# Patient Record
Sex: Female | Born: 1950
Health system: Southern US, Community
[De-identification: ages and names within clinical notes are randomized; demographics above are authoritative.]

## PROBLEM LIST (undated history)

## (undated) DIAGNOSIS — G808 Other cerebral palsy: Secondary | ICD-10-CM

## (undated) DIAGNOSIS — R0602 Shortness of breath: Secondary | ICD-10-CM

## (undated) DIAGNOSIS — M1990 Unspecified osteoarthritis, unspecified site: Secondary | ICD-10-CM

## (undated) DIAGNOSIS — J302 Other seasonal allergic rhinitis: Secondary | ICD-10-CM

## (undated) DIAGNOSIS — F988 Other specified behavioral and emotional disorders with onset usually occurring in childhood and adolescence: Secondary | ICD-10-CM

## (undated) DIAGNOSIS — F419 Anxiety disorder, unspecified: Secondary | ICD-10-CM

## (undated) DIAGNOSIS — C801 Malignant (primary) neoplasm, unspecified: Secondary | ICD-10-CM

## (undated) DIAGNOSIS — E785 Hyperlipidemia, unspecified: Secondary | ICD-10-CM

## (undated) DIAGNOSIS — C4491 Basal cell carcinoma of skin, unspecified: Secondary | ICD-10-CM

## (undated) DIAGNOSIS — C50511 Malignant neoplasm of lower-outer quadrant of right female breast: Secondary | ICD-10-CM

## (undated) DIAGNOSIS — G3184 Mild cognitive impairment, so stated: Secondary | ICD-10-CM

## (undated) HISTORY — PX: COLONOSCOPY: SHX174

## (undated) HISTORY — PX: GREAT TOE ARTHRODESIS, INTERPHALANGEAL JOINT: SUR55

## (undated) HISTORY — DX: Other specified behavioral and emotional disorders with onset usually occurring in childhood and adolescence: F98.8

## (undated) HISTORY — DX: Mild cognitive impairment, so stated: G31.84

## (undated) HISTORY — DX: Basal cell carcinoma of skin, unspecified: C44.91

## (undated) HISTORY — DX: Malignant neoplasm of lower-outer quadrant of right female breast: C50.511

## (undated) HISTORY — DX: Other cerebral palsy: G80.8

---

## 1997-04-11 ENCOUNTER — Other Ambulatory Visit: Admission: RE | Admit: 1997-04-11 | Discharge: 1997-04-11 | Payer: Self-pay | Admitting: Obstetrics and Gynecology

## 1997-09-19 ENCOUNTER — Ambulatory Visit (HOSPITAL_COMMUNITY): Admission: RE | Admit: 1997-09-19 | Discharge: 1997-09-19 | Payer: Self-pay | Admitting: General Surgery

## 1997-10-04 ENCOUNTER — Encounter: Payer: Self-pay | Admitting: General Surgery

## 1997-10-04 ENCOUNTER — Ambulatory Visit (HOSPITAL_COMMUNITY): Admission: RE | Admit: 1997-10-04 | Discharge: 1997-10-04 | Payer: Self-pay | Admitting: General Surgery

## 1997-10-07 ENCOUNTER — Other Ambulatory Visit: Admission: RE | Admit: 1997-10-07 | Discharge: 1997-10-07 | Payer: Self-pay | Admitting: Obstetrics and Gynecology

## 1997-11-01 ENCOUNTER — Ambulatory Visit: Admission: RE | Admit: 1997-11-01 | Discharge: 1997-11-01 | Payer: Self-pay | Admitting: Gynecologic Oncology

## 1997-11-02 ENCOUNTER — Ambulatory Visit (HOSPITAL_COMMUNITY): Admission: RE | Admit: 1997-11-02 | Discharge: 1997-11-02 | Payer: Self-pay | Admitting: Gastroenterology

## 1998-01-21 HISTORY — PX: ABDOMINAL HYSTERECTOMY: SHX81

## 1998-01-24 ENCOUNTER — Ambulatory Visit: Admission: RE | Admit: 1998-01-24 | Discharge: 1998-01-24 | Payer: Self-pay | Admitting: Gynecology

## 1998-02-14 ENCOUNTER — Ambulatory Visit: Admission: RE | Admit: 1998-02-14 | Discharge: 1998-02-14 | Payer: Self-pay | Admitting: Gynecology

## 1998-11-30 ENCOUNTER — Other Ambulatory Visit: Admission: RE | Admit: 1998-11-30 | Discharge: 1998-11-30 | Payer: Self-pay | Admitting: Obstetrics and Gynecology

## 2001-12-14 ENCOUNTER — Encounter: Payer: Self-pay | Admitting: General Surgery

## 2001-12-14 ENCOUNTER — Ambulatory Visit (HOSPITAL_COMMUNITY): Admission: RE | Admit: 2001-12-14 | Discharge: 2001-12-14 | Payer: Self-pay | Admitting: General Surgery

## 2001-12-16 ENCOUNTER — Ambulatory Visit (HOSPITAL_COMMUNITY): Admission: RE | Admit: 2001-12-16 | Discharge: 2001-12-16 | Payer: Self-pay | Admitting: General Surgery

## 2001-12-16 ENCOUNTER — Encounter: Payer: Self-pay | Admitting: General Surgery

## 2001-12-31 ENCOUNTER — Encounter: Admission: RE | Admit: 2001-12-31 | Discharge: 2001-12-31 | Payer: Self-pay | Admitting: General Surgery

## 2001-12-31 ENCOUNTER — Encounter: Payer: Self-pay | Admitting: General Surgery

## 2002-06-15 ENCOUNTER — Encounter: Admission: RE | Admit: 2002-06-15 | Discharge: 2002-06-15 | Payer: Self-pay | Admitting: General Surgery

## 2002-06-15 ENCOUNTER — Encounter: Payer: Self-pay | Admitting: General Surgery

## 2003-06-14 DIAGNOSIS — C4491 Basal cell carcinoma of skin, unspecified: Secondary | ICD-10-CM

## 2003-06-14 HISTORY — DX: Basal cell carcinoma of skin, unspecified: C44.91

## 2003-07-06 ENCOUNTER — Ambulatory Visit (HOSPITAL_COMMUNITY): Admission: RE | Admit: 2003-07-06 | Discharge: 2003-07-06 | Payer: Self-pay | Admitting: Gastroenterology

## 2003-07-06 ENCOUNTER — Encounter (INDEPENDENT_AMBULATORY_CARE_PROVIDER_SITE_OTHER): Payer: Self-pay | Admitting: Specialist

## 2003-07-28 ENCOUNTER — Encounter: Admission: RE | Admit: 2003-07-28 | Discharge: 2003-07-28 | Payer: Self-pay | Admitting: General Surgery

## 2003-08-09 ENCOUNTER — Ambulatory Visit (HOSPITAL_COMMUNITY): Admission: RE | Admit: 2003-08-09 | Discharge: 2003-08-09 | Payer: Self-pay | Admitting: Ophthalmology

## 2003-09-22 ENCOUNTER — Ambulatory Visit (HOSPITAL_COMMUNITY): Admission: RE | Admit: 2003-09-22 | Discharge: 2003-09-22 | Payer: Self-pay | Admitting: Plastic Surgery

## 2003-09-22 ENCOUNTER — Encounter (INDEPENDENT_AMBULATORY_CARE_PROVIDER_SITE_OTHER): Payer: Self-pay | Admitting: Specialist

## 2003-09-22 ENCOUNTER — Ambulatory Visit (HOSPITAL_BASED_OUTPATIENT_CLINIC_OR_DEPARTMENT_OTHER): Admission: RE | Admit: 2003-09-22 | Discharge: 2003-09-22 | Payer: Self-pay | Admitting: Plastic Surgery

## 2005-05-22 ENCOUNTER — Encounter: Payer: Self-pay | Admitting: General Surgery

## 2005-09-02 ENCOUNTER — Encounter: Admission: RE | Admit: 2005-09-02 | Discharge: 2005-09-02 | Payer: Self-pay | Admitting: General Surgery

## 2005-09-02 ENCOUNTER — Ambulatory Visit (HOSPITAL_BASED_OUTPATIENT_CLINIC_OR_DEPARTMENT_OTHER): Admission: RE | Admit: 2005-09-02 | Discharge: 2005-09-02 | Payer: Self-pay | Admitting: General Surgery

## 2005-09-02 ENCOUNTER — Encounter (INDEPENDENT_AMBULATORY_CARE_PROVIDER_SITE_OTHER): Payer: Self-pay | Admitting: *Deleted

## 2009-10-21 HISTORY — PX: BREAST SURGERY: SHX581

## 2009-11-06 ENCOUNTER — Ambulatory Visit (HOSPITAL_COMMUNITY): Admission: RE | Admit: 2009-11-06 | Discharge: 2009-11-06 | Payer: Self-pay | Admitting: Surgery

## 2009-11-06 ENCOUNTER — Encounter: Admission: RE | Admit: 2009-11-06 | Discharge: 2009-11-06 | Payer: Self-pay | Admitting: Surgery

## 2009-11-07 ENCOUNTER — Ambulatory Visit: Payer: Self-pay | Admitting: Oncology

## 2009-12-21 HISTORY — PX: BREAST SURGERY: SHX581

## 2010-01-03 ENCOUNTER — Ambulatory Visit: Payer: Self-pay | Admitting: Psychiatry

## 2010-01-03 ENCOUNTER — Ambulatory Visit: Payer: Self-pay | Admitting: Oncology

## 2010-01-08 ENCOUNTER — Ambulatory Visit
Admission: RE | Admit: 2010-01-08 | Discharge: 2010-01-18 | Payer: Self-pay | Source: Home / Self Care | Attending: Radiation Oncology | Admitting: Radiation Oncology

## 2010-01-26 ENCOUNTER — Ambulatory Visit
Admission: RE | Admit: 2010-01-26 | Discharge: 2010-02-20 | Payer: Self-pay | Source: Home / Self Care | Attending: Radiation Oncology | Admitting: Radiation Oncology

## 2010-02-02 ENCOUNTER — Encounter
Admission: RE | Admit: 2010-02-02 | Discharge: 2010-02-02 | Payer: Self-pay | Source: Home / Self Care | Attending: Radiation Oncology | Admitting: Radiation Oncology

## 2010-02-11 ENCOUNTER — Encounter: Payer: Self-pay | Admitting: General Surgery

## 2010-02-19 ENCOUNTER — Ambulatory Visit: Payer: Self-pay | Admitting: Oncology

## 2010-02-21 ENCOUNTER — Ambulatory Visit: Payer: BC Managed Care – PPO | Attending: Radiation Oncology | Admitting: Radiation Oncology

## 2010-02-21 DIAGNOSIS — F411 Generalized anxiety disorder: Secondary | ICD-10-CM | POA: Insufficient documentation

## 2010-02-21 DIAGNOSIS — R5381 Other malaise: Secondary | ICD-10-CM | POA: Insufficient documentation

## 2010-02-21 DIAGNOSIS — R21 Rash and other nonspecific skin eruption: Secondary | ICD-10-CM | POA: Insufficient documentation

## 2010-02-21 DIAGNOSIS — J069 Acute upper respiratory infection, unspecified: Secondary | ICD-10-CM | POA: Insufficient documentation

## 2010-02-21 DIAGNOSIS — C50919 Malignant neoplasm of unspecified site of unspecified female breast: Secondary | ICD-10-CM | POA: Insufficient documentation

## 2010-02-21 DIAGNOSIS — R5383 Other fatigue: Secondary | ICD-10-CM | POA: Insufficient documentation

## 2010-02-21 DIAGNOSIS — Z510 Encounter for antineoplastic radiation therapy: Secondary | ICD-10-CM | POA: Insufficient documentation

## 2010-02-21 DIAGNOSIS — N644 Mastodynia: Secondary | ICD-10-CM | POA: Insufficient documentation

## 2010-02-27 ENCOUNTER — Ambulatory Visit: Payer: BC Managed Care – PPO | Admitting: Radiation Oncology

## 2010-04-05 LAB — CANCER ANTIGEN 27.29: CA 27.29: 21 U/mL (ref 0–39)

## 2010-04-05 LAB — COMPREHENSIVE METABOLIC PANEL
ALT: 21 U/L (ref 0–35)
AST: 20 U/L (ref 0–37)
Albumin: 4.3 g/dL (ref 3.5–5.2)
Alkaline Phosphatase: 109 U/L (ref 39–117)
BUN: 16 mg/dL (ref 6–23)
CO2: 28 mEq/L (ref 19–32)
Calcium: 9.6 mg/dL (ref 8.4–10.5)
Chloride: 106 mEq/L (ref 96–112)
Creatinine, Ser: 0.75 mg/dL (ref 0.4–1.2)
GFR calc Af Amer: >60 mL/min (ref >60)
GFR calc non Af Amer: >60 mL/min (ref >60)
Glucose, Bld: 94 mg/dL (ref 70–99)
Potassium: 3.4 mEq/L — ABNORMAL LOW (ref 3.5–5.1)
Sodium: 141 mEq/L (ref 135–145)
Total Bilirubin: 0.5 mg/dL (ref 0.3–1.2)
Total Protein: 7.1 g/dL (ref 6.0–8.3)

## 2010-04-05 LAB — DIFFERENTIAL
Eosinophils Absolute: 0.3 10*3/uL (ref 0.0–0.7)
Lymphocytes Relative: 23 % (ref 12–46)
Lymphs Abs: 2 10*3/uL (ref 0.7–4.0)
Monocytes Relative: 6 % (ref 3–12)
Neutro Abs: 5.9 10*3/uL (ref 1.7–7.7)
Neutrophils Relative %: 68 % (ref 43–77)

## 2010-04-05 LAB — CBC
HCT: 40.9 % (ref 36.0–46.0)
MCH: 30.6 pg (ref 26.0–34.0)
MCHC: 33.5 g/dL (ref 30.0–36.0)
RDW: 12.5 % (ref 11.5–15.5)

## 2010-04-05 LAB — SURGICAL PCR SCREEN
MRSA, PCR: NEGATIVE
Staphylococcus aureus: NEGATIVE

## 2010-04-05 LAB — URINALYSIS, ROUTINE W REFLEX MICROSCOPIC
Bilirubin Urine: NEGATIVE
Glucose, UA: NEGATIVE mg/dL
Hgb urine dipstick: NEGATIVE
Ketones, ur: NEGATIVE mg/dL
Nitrite: NEGATIVE
Protein, ur: NEGATIVE mg/dL
Specific Gravity, Urine: 1.023 (ref 1.005–1.030)
Urobilinogen, UA: 1 mg/dL (ref 0.0–1.0)
pH: 6.5 (ref 5.0–8.0)

## 2010-04-23 ENCOUNTER — Encounter (HOSPITAL_BASED_OUTPATIENT_CLINIC_OR_DEPARTMENT_OTHER): Payer: BC Managed Care – PPO | Admitting: Oncology

## 2010-04-23 DIAGNOSIS — C50919 Malignant neoplasm of unspecified site of unspecified female breast: Secondary | ICD-10-CM

## 2010-04-23 DIAGNOSIS — Z8522 Personal history of malignant neoplasm of nasal cavities, middle ear, and accessory sinuses: Secondary | ICD-10-CM

## 2010-04-23 DIAGNOSIS — M1990 Unspecified osteoarthritis, unspecified site: Secondary | ICD-10-CM

## 2010-05-15 ENCOUNTER — Ambulatory Visit: Payer: BC Managed Care – PPO | Attending: Radiation Oncology | Admitting: Radiation Oncology

## 2010-06-08 NOTE — Op Note (Signed)
NAME:  Jennifer Eaton, Jennifer Eaton                      ACCOUNT NO.:  [id number]   MEDICAL RECORD NO.:  [id number]                   PATIENT TYPE:  AMB   LOCATION:  ENDO                                 FACILITY:  [facility]   PHYSICIAN:  John C. Madilyn Fireman, M.D.                 DATE OF BIRTH:  08-02-1950   DATE OF PROCEDURE:  07/06/2003  DATE OF DISCHARGE:                                 OPERATIVE REPORT   PROCEDURE:  Colonoscopy with polypectomy.   INDICATIONS FOR PROCEDURE:  Personal history of breast cancer with last  colon screening in 1992.   ENDOSCOPIST:  Everardo All. Madilyn Fireman, M.D.   PROCEDURE:  The patient was placed in the left lateral decubitus position  and placed on the pulse monitor with continuous low-flow oxygen delivered by  nasal cannula.  She was sedated with 62.5 mcg of IV Fentanyl and 6 mg of IV  Versed.  The Olympus video colonoscope was inserted into the rectum and  advanced through the cecum, confirmed by transillumination of McBurney's  point and visualization of the ileocecal valve and appendiceal orifice.  The  prep was good.  The cecum appeared normal. At the junction between the cecum  and ascending colon across from the ileocecal valve there was a soft,  multilobulated, sessile polyp approximately 1.5 to 1.75 cm in diameter.  This was removed by snare.  The remainder of the ascending, transverse,  descending, sigmoid and rectum appeared normal with no further polyps,  masses, diverticula, or other mucosal abnormalities.   The scope was then withdrawn and the patient returned to the recovery room  in stable condition.  She tolerated the procedure well; and there were no  immediate complications.   IMPRESSION:  Ascending colon polyp, otherwise normal study.   PLAN:  Await histology to determine interval for next colonoscopy.                                               John C. Madilyn Fireman, M.D.    JCH/MEDQ  D:  07/06/2003  T:  07/06/2003  Job:  [id number]   cc:   Dellis Anes. Idell Pickles,  M.D.  [address]  [place]  [place] [address]  Fax: [phone]

## 2010-06-08 NOTE — Op Note (Signed)
NAME:  Jennifer Eaton, Jennifer Eaton                      ACCOUNT NO.:  [id number]   MEDICAL RECORD NO.:  [id number]                   PATIENT TYPE:  OIB   LOCATION:  2859                                 FACILITY:  [facility]   PHYSICIAN:  Robert L. Dione Booze, M.D.               DATE OF BIRTH:  January 15, 1951   DATE OF PROCEDURE:  08/09/2003  DATE OF DISCHARGE:  08/09/2003                                 OPERATIVE REPORT   PREOPERATIVE DIAGNOSIS:  Moderately severe dermatochalasis of the skin of  each upper eyelid causing visual impairment and visual symptoms.   POSTOPERATIVE DIAGNOSIS:  Moderately severe dermatochalasis of the skin of  each upper eyelid causing visual impairment and visual symptoms.   OPERATION PERFORMED:  Upper eyelid blepharoplasty.   SURGEON:  Robert L. Dione Booze, M.D.   ANESTHESIA:  1% Xylocaine with epinephrine.   INDICATIONS AND JUSTIFICATIONS FOR THE PROCEDURE:  Jennifer Eaton is a 60-  year-old lady seen in my office through the referral of friends of hers.  She basically was seen regarding droopy eyelids and has noticed that this  bothers her enormously and has gotten worse the last year.  She feels this  causes irritation, and she can feel the weight of the lids and it makes her  tired.  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  The photograph  shows the margin reflex distance on the right is basically 0 mm since the  skin comes to the pupillary reflex and on the left is probably about 3 mm.  Visual field testing, however, shows a very significant loss of  approximately 75% of the visual field in the left eye when the skin is taped  compared to when it is not tapped and approximately 80% or even 30 degrees  of the field in the right eye when the skin is taped compared to when it is  not taped.  Permission has been  obtained to perform blepharoplasties for  medical reasons to help her symptoms.   Otherwise the exam shows the pressure is 12 in each eye and her vision  corrected to 20/25 in each eye.  The pupils, motility, conjunctiva, anterior  chamber, and dilated fundus exam are unremarkable except she does have  slightly shallow anterior chambers, possibly making her a mild candidate  eventually for narrow-angle glaucoma, although this is not an immediate  concern.  She also reports that she even has to pull the skin up to see  better.   JUSTIFICATION FOR PERFORMING PROCEDURE IN OUTPATIENT SETTING:  Routine.   JUSTIFICATION FOR OVERNIGHT STAY:  None.   PROCEDURE:  The patient arrived in the minor surgery room and was prepped  and draped in the routine fashion and had the administration of 1% Xylocaine  with epinephrine  to the skin of each upper eyelid.  The skin to be removed  was carefully demarcated and then excised.  There was a significant amount  of bleeding that was controlled with pressure and cautery, and underlying  fatty tissue was then excised.  Each wound was closed with a running 6-0  nylon suture and the skin was then cleaned carefully and Polysporin ointment  was put inside each lower lid and on the skin.  Cold compresses were then  applied.  The patient went home, having done nicely.   FOLLOW-UP CARE:  The patient is to be seen in my office in six days to have  the sutures removed.  She is to use warm compresses starting one day after  the surgery.                                               Robert L. Dione Booze, M.D.    RLG/MEDQ  D:  08/09/2003  T:  08/10/2003  Job:  [id number]   cc:   Dellis Anes. Idell Pickles, M.D.  [address]  [place]  [place] [address]  Fax: [phone]   Dr. Ether Griffins

## 2010-06-08 NOTE — Op Note (Addendum)
NAMESHILOH, Jennifer Eaton            ACCOUNT NO.:  [id number]   MEDICAL RECORD NO.:  [id number]          PATIENT TYPE:  AMB   LOCATION:  DSC                          FACILITY:  [facility]   PHYSICIAN:  Rose Phi. Maple Hudson, M.D.   DATE OF BIRTH:  Jun 07, 1950   DATE OF PROCEDURE:  DATE OF DISCHARGE:                                 OPERATIVE REPORT   PREOPERATIVE DIAGNOSIS:  Right breast nodule.   POSTOPERATIVE DIAGNOSIS:  Right breast nodule.   PROCEDURE:  Excision of right breast nodule with needle localization ____                                                           QA MARKER: 19____ mammogram.                                                SURGEON:  Rose Phi. Maple Hudson, M.D.   ANESTHESIA:  General.   OPERATIVE PROCEDURE:  Prior to coming to the operating room a localizing  wire was placed in the nodule at the 9:00 position of the right breast.   After suitable general anesthesia was induced, the patient was placed in the  supine position with arms extended on the arm board.  Right breast was  prepped and draped in the usual fashion.   A curved incision using the wire as a guide was then outlined with a marking  pencil and area infiltrated with a local anesthetic.  Incision was made and  the wire exposed, and the wire and surrounding tissue were excised.  Specimen mammogram confirmed the removal of the lesion.  Hemostasis obtained  with a cautery.  The deeper layers were closed with 3-0 Vicryl and the skin  with subcuticular 4-0 Monocryl and Steri-Strips.  Dressing applied.  The  patient transferred to the recovery room in satisfactory condition having  tolerated the procedure well.      Rose Phi. Maple Hudson, M.D.  Electronically Signed     PRY/MEDQ  D:  09/02/2005  T:  09/03/2005  Job:  [id number]

## 2010-07-11 ENCOUNTER — Other Ambulatory Visit: Payer: Self-pay | Admitting: Oncology

## 2010-07-11 ENCOUNTER — Encounter (HOSPITAL_BASED_OUTPATIENT_CLINIC_OR_DEPARTMENT_OTHER): Payer: BC Managed Care – PPO | Admitting: Oncology

## 2010-07-11 DIAGNOSIS — Z8522 Personal history of malignant neoplasm of nasal cavities, middle ear, and accessory sinuses: Secondary | ICD-10-CM

## 2010-07-11 DIAGNOSIS — M1990 Unspecified osteoarthritis, unspecified site: Secondary | ICD-10-CM

## 2010-07-11 DIAGNOSIS — C50919 Malignant neoplasm of unspecified site of unspecified female breast: Secondary | ICD-10-CM

## 2010-07-11 LAB — CBC WITH DIFFERENTIAL/PLATELET
BASO%: 0.4 % (ref 0.0–2.0)
Basophils Absolute: 0 10*3/uL (ref 0.0–0.1)
Eosinophils Absolute: 0.2 10*3/uL (ref 0.0–0.5)
HCT: 38.8 % (ref 34.8–46.6)
HGB: 13.4 g/dL (ref 11.6–15.9)
MCH: 31.1 pg (ref 25.1–34.0)
MCHC: 34.6 g/dL (ref 31.5–36.0)
MCV: 89.9 fL (ref 79.5–101.0)
MONO%: 7.4 % (ref 0.0–14.0)
NEUT#: 4 10*3/uL (ref 1.5–6.5)
NEUT%: 72.4 % (ref 38.4–76.8)
Platelets: 240 10*3/uL (ref 145–400)
RBC: 4.31 10*6/uL (ref 3.70–5.45)
RDW: 12.3 % (ref 11.2–14.5)
WBC: 5.5 10*3/uL (ref 3.9–10.3)
lymph#: 0.9 10*3/uL (ref 0.9–3.3)

## 2010-07-11 LAB — COMPREHENSIVE METABOLIC PANEL
AST: 31 U/L (ref 0–37)
Albumin: 4.7 g/dL (ref 3.5–5.2)
Alkaline Phosphatase: 89 U/L (ref 39–117)
BUN: 13 mg/dL (ref 6–23)
CO2: 24 mEq/L (ref 19–32)
Calcium: 9.8 mg/dL (ref 8.4–10.5)
Chloride: 105 mEq/L (ref 96–112)
Creatinine, Ser: 0.88 mg/dL (ref 0.50–1.10)
Glucose, Bld: 89 mg/dL (ref 70–99)
Potassium: 3.7 mEq/L (ref 3.5–5.3)
Sodium: 139 mEq/L (ref 135–145)
Total Protein: 6.8 g/dL (ref 6.0–8.3)

## 2010-07-18 ENCOUNTER — Telehealth (INDEPENDENT_AMBULATORY_CARE_PROVIDER_SITE_OTHER): Payer: Self-pay | Admitting: Surgery

## 2010-10-18 ENCOUNTER — Encounter (HOSPITAL_BASED_OUTPATIENT_CLINIC_OR_DEPARTMENT_OTHER): Payer: BC Managed Care – PPO | Admitting: Oncology

## 2010-10-18 ENCOUNTER — Other Ambulatory Visit: Payer: Self-pay | Admitting: Oncology

## 2010-10-18 DIAGNOSIS — Z8522 Personal history of malignant neoplasm of nasal cavities, middle ear, and accessory sinuses: Secondary | ICD-10-CM

## 2010-10-18 DIAGNOSIS — M1990 Unspecified osteoarthritis, unspecified site: Secondary | ICD-10-CM

## 2010-10-18 DIAGNOSIS — C50919 Malignant neoplasm of unspecified site of unspecified female breast: Secondary | ICD-10-CM

## 2010-10-18 LAB — CBC WITH DIFFERENTIAL/PLATELET
Basophils Absolute: 0 10*3/uL (ref 0.0–0.1)
EOS%: 4.4 % (ref 0.0–7.0)
HCT: 38.1 % (ref 34.8–46.6)
LYMPH%: 16.8 % (ref 14.0–49.7)
MCH: 31.3 pg (ref 25.1–34.0)
MCHC: 34.7 g/dL (ref 31.5–36.0)
MCV: 90.1 fL (ref 79.5–101.0)
MONO#: 0.3 10*3/uL (ref 0.1–0.9)
MONO%: 5.5 % (ref 0.0–14.0)
NEUT#: 4.3 10*3/uL (ref 1.5–6.5)
NEUT%: 73 % (ref 38.4–76.8)
Platelets: 230 10*3/uL (ref 145–400)
RBC: 4.22 10*6/uL (ref 3.70–5.45)
RDW: 12.4 % (ref 11.2–14.5)
lymph#: 1 10*3/uL (ref 0.9–3.3)

## 2010-10-18 LAB — COMPREHENSIVE METABOLIC PANEL
ALT: 28 U/L (ref 0–35)
AST: 22 U/L (ref 0–37)
Albumin: 3.9 g/dL (ref 3.5–5.2)
Alkaline Phosphatase: 103 U/L (ref 39–117)
BUN: 15 mg/dL (ref 6–23)
CO2: 25 mEq/L (ref 19–32)
Calcium: 9.9 mg/dL (ref 8.4–10.5)
Chloride: 101 mEq/L (ref 96–112)
Potassium: 3.5 mEq/L (ref 3.5–5.3)
Sodium: 138 mEq/L (ref 135–145)
Total Bilirubin: 0.2 mg/dL — ABNORMAL LOW (ref 0.3–1.2)
Total Protein: 7.5 g/dL (ref 6.0–8.3)

## 2010-10-25 ENCOUNTER — Encounter (HOSPITAL_BASED_OUTPATIENT_CLINIC_OR_DEPARTMENT_OTHER): Payer: BC Managed Care – PPO | Admitting: Oncology

## 2010-10-25 DIAGNOSIS — C50919 Malignant neoplasm of unspecified site of unspecified female breast: Secondary | ICD-10-CM

## 2010-12-04 NOTE — Progress Notes (Signed)
This encounter was created in error - please disregard.

## 2010-12-24 ENCOUNTER — Telehealth: Payer: Self-pay | Admitting: *Deleted

## 2010-12-24 NOTE — Telephone Encounter (Signed)
patient confirmed over the phone the new date and time on 02-2011 appointments

## 2011-01-22 HISTORY — PX: MOUTH SURGERY: SHX715

## 2011-01-23 ENCOUNTER — Other Ambulatory Visit: Payer: Self-pay

## 2011-01-23 DIAGNOSIS — C50919 Malignant neoplasm of unspecified site of unspecified female breast: Secondary | ICD-10-CM

## 2011-01-23 MED ORDER — LETROZOLE 2.5 MG PO TABS: 2.5000 mg | ORAL_TABLET | Freq: Every day | ORAL | Status: AC

## 2011-03-01 ENCOUNTER — Other Ambulatory Visit: Payer: BC Managed Care – PPO | Admitting: Lab

## 2011-03-04 ENCOUNTER — Other Ambulatory Visit: Payer: BC Managed Care – PPO | Admitting: Lab

## 2011-03-05 ENCOUNTER — Ambulatory Visit (HOSPITAL_BASED_OUTPATIENT_CLINIC_OR_DEPARTMENT_OTHER): Payer: BC Managed Care – PPO | Admitting: Oncology

## 2011-03-05 ENCOUNTER — Other Ambulatory Visit: Payer: BC Managed Care – PPO

## 2011-03-05 VITALS — BP 125/81 | HR 85 | Temp 98.6°F | Ht 65.0 in | Wt 224.5 lb

## 2011-03-05 DIAGNOSIS — Z8522 Personal history of malignant neoplasm of nasal cavities, middle ear, and accessory sinuses: Secondary | ICD-10-CM

## 2011-03-05 DIAGNOSIS — C50919 Malignant neoplasm of unspecified site of unspecified female breast: Secondary | ICD-10-CM | POA: Insufficient documentation

## 2011-03-05 DIAGNOSIS — M1990 Unspecified osteoarthritis, unspecified site: Secondary | ICD-10-CM

## 2011-03-05 LAB — CBC WITH DIFFERENTIAL/PLATELET
BASO%: 0.2 % (ref 0.0–2.0)
Basophils Absolute: 0 10*3/uL (ref 0.0–0.1)
EOS%: 1.8 % (ref 0.0–7.0)
HCT: 40.4 % (ref 34.8–46.6)
HGB: 14.1 g/dL (ref 11.6–15.9)
LYMPH%: 16.9 % (ref 14.0–49.7)
MCH: 31.4 pg (ref 25.1–34.0)
MCHC: 35 g/dL (ref 31.5–36.0)
MCV: 89.6 fL (ref 79.5–101.0)
MONO#: 0.5 10*3/uL (ref 0.1–0.9)
MONO%: 6.4 % (ref 0.0–14.0)
NEUT#: 6.1 10*3/uL (ref 1.5–6.5)
NEUT%: 74.7 % (ref 38.4–76.8)
Platelets: 271 10*3/uL (ref 145–400)
RBC: 4.51 10*6/uL (ref 3.70–5.45)
RDW: 12.6 % (ref 11.2–14.5)
WBC: 8.1 10*3/uL (ref 3.9–10.3)
lymph#: 1.4 10*3/uL (ref 0.9–3.3)

## 2011-03-05 LAB — COMPREHENSIVE METABOLIC PANEL
ALT: 31 U/L (ref 0–35)
AST: 25 U/L (ref 0–37)
Albumin: 4.2 g/dL (ref 3.5–5.2)
Alkaline Phosphatase: 97 U/L (ref 39–117)
BUN: 13 mg/dL (ref 6–23)
CO2: 25 mEq/L (ref 19–32)
Calcium: 10 mg/dL (ref 8.4–10.5)
Chloride: 104 mEq/L (ref 96–112)
Creatinine, Ser: 0.88 mg/dL (ref 0.50–1.10)
Glucose, Bld: 90 mg/dL (ref 70–99)
Potassium: 3.7 mEq/L (ref 3.5–5.3)
Sodium: 140 mEq/L (ref 135–145)
Total Bilirubin: 0.3 mg/dL (ref 0.3–1.2)
Total Protein: 7.9 g/dL (ref 6.0–8.3)

## 2011-03-05 NOTE — Progress Notes (Signed)
ID: DHWANI VENKATESH  DOB: 12-18-1950  MR#: [id number]  CSN#: [id number]   Interval History:   Jennifer Eaton returns today for followup of her breast cancer. The interval history is unremarkable. They're expecting their first grandchild in September and she is excited about that. She's also worried because it's been over a year and she hasn't had a breast MRI. She is getting that done through [facility] under Carylon Perches, she tells me. She just had a a tooth removed of with plans for an implant. She is still on antibiotics for that.  ROS:  Otherwise a detailed review of systems was noncontributory. She has very minimal hot flashes, and no other significant side effects from her letrozole.  Allergies not on file  Current Outpatient Prescriptions  Medication Sig Dispense Refill  . aspirin 81 MG tablet Take 81 mg by mouth daily.      . Calcium-Magnesium 500-250 MG TABS Take 2 tablets by mouth 2 (two) times daily.      . Cholecalciferol (VITAMIN D3) 1000 UNITS CAPS Take 2 capsules by mouth.      . diazepam (VALIUM) 5 MG tablet Take 5 mg by mouth at bedtime as needed.      . fish oil-omega-3 fatty acids 1000 MG capsule Take 1 g by mouth daily.      . fluticasone (FLONASE) 50 MCG/ACT nasal spray Place 2 sprays into the nose daily as needed.      Marland Kitchen letrozole (FEMARA) 2.5 MG tablet Take 2.5 mg by mouth daily.      . Multiple Vitamin CAPS Take by mouth.      . rosuvastatin (CRESTOR) 10 MG tablet Take 10 mg by mouth daily.      Marland Kitchen zolpidem (AMBIEN) 5 MG tablet Take 5 mg by mouth at bedtime as needed.       PAST MEDICAL HISTORY:  Otherwise significant for the patient being status post hysterectomy and bilateral salpingo-oophorectomy in the year 2000. She had some left foot surgery with a plate in her big toe.  She had a basal cell carcinoma with Mohs surgery removed from the nose, and a squamous cell skin cancer removed from the left lateral arm.  She has moderate osteoarthritis problems especially involving the  knees, mild GERD symptoms but no other surgeries or illnesses of concern.  FAMILY HISTORY: The patient's father died at the age of 77 after myocardial infarction.  The patient's mother died from multiple myeloma which was diagnosed at age 65 and took her life 4 years later when she was 62.  The patient has one brother and one sister.  There is no breast cancer in the immediate family but 2 of the patient's maternal aunts had breast cancer one of those two aunts had a daughter, the patient's first cousin, who had ovarian cancer at an early age.  Abena tells me she was tested for the BRCA-1 and 2 genes at [facility] recently and that she was negative.  GYN HISTORY:  She is GX P2, first pregnancy to term at age 51.  She underwent hysterectomy with bilateral salpingo-oophorectomy in the year 2000. She did not take hormone replacement following that.   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  Her husband of 39 years, Onalee Hua, works at [employer] of [place].  Son Nida Boatman lives in [place].  He is 39 and works as a Teacher, English as a foreign language.  Son Casimiro Needle, [place], lives in [place] and works as an Museum/gallery exhibitions officer and  recently returned from 16 months in [place].  The patient has no grandchildren.  She attends a [employer].    Objective: Middle-aged white woman who appears slightly anxious  Filed Vitals:   03/05/11 1607  BP: 125/81  Pulse: 85  Temp: 98.6 F (37 C)    BMI: Body mass index is 37.36 kg/(m^2).   ECOG FS: 0  Physical Exam:   Sclerae unicteric  Oropharynx clear  No peripheral adenopathy  Lungs clear -- no rales or rhonchi  Heart regular rate and rhythm  Abdomen benign  MSK no focal spinal tenderness, no peripheral edema  Neuro nonfocal  Breast exam: The right breast is status post lumpectomy, no evidence of local recurrence, no skin changes, no nipple retraction; the left breast shows no suspicious masses.  Lab Results:      Chemistry      Component Value  Date/Time   NA 138 10/18/2010 1557   NA 138 10/18/2010 1557   K 3.5 10/18/2010 1557   K 3.5 10/18/2010 1557   CL 101 10/18/2010 1557   CL 101 10/18/2010 1557   CO2 25 10/18/2010 1557   CO2 25 10/18/2010 1557   BUN 15 10/18/2010 1557   BUN 15 10/18/2010 1557   CREATININE 0.77 10/18/2010 1557   CREATININE 0.77 10/18/2010 1557      Component Value Date/Time   CALCIUM 9.9 10/18/2010 1557   CALCIUM 9.9 10/18/2010 1557   ALKPHOS 103 10/18/2010 1557   ALKPHOS 103 10/18/2010 1557   AST 22 10/18/2010 1557   AST 22 10/18/2010 1557   ALT 28 10/18/2010 1557   ALT 28 10/18/2010 1557   BILITOT 0.2* 10/18/2010 1557   BILITOT 0.2* 10/18/2010 1557       Lab Results  Component Value Date   WBC 8.1 03/05/2011   HGB 14.1 03/05/2011   HCT 40.4 03/05/2011   MCV 89.6 03/05/2011   PLT 271 03/05/2011   NEUTROABS 6.1 03/05/2011    Studies/Results:  DIGITAL DIAGNOSTIC RIGHT MAMMOGRAM WITH CAD 02/02/2010 Comparison: The multiple prior studies from [facility] dated  12/28/2009 and earlier  Findings: There are scattered fibroglandular densities.  Postoperative changes are seen in the upper-outer quadrant of the  right breast. There are scattered faint calcifications in the  upper-outer quadrant of the right breast. However, no suspicious  microcalcifications are identified.  Mammographic images were processed with CAD.  IMPRESSION:  Expected postoperative changes on the right. No suspicious  microcalcifications identified. Bilateral diagnostic mammogram will  be due in September 2012.   Assessment: A 61 year old [place] woman with a history of intrauterine diethylstilbestrol exposure status post right lumpectomy October 2011 for a either a double T1 (stage IA) or a single T2 (stage IIA) invasive ductal carcinoma, with 0/4 sentinel lymph nodes involved, strongly estrogen and progesterone receptor positive with a low MIB-1 and no HER-2 amplification; Oncotype DX recurrence score was 8 predicting a 6% risk of  recurrence within 10 years with antiestrogen therapy.  She completed radiation in March 2012 and has been on letrozole since with excellent tolerance.     Plan: The plan is to continue letrozole to a total of 5 years. I suggested followup here in 6 months but she is more comfortable with a 4 month followup. She continues to explore the possibility of bilateral mastectomies with reconstruction and she doubtless will be discussing that as well with Dr. Barbaraann Cao at [facility] when she sees him later this month.  Today I wrote her for acyclovir to  take twice a day since she has a history of developing mouth sores whenever she has a dental procedure. Otherwise she knows to call for any problems that may develop before the next visit     Leoncio Hansen C 03/05/2011

## 2011-03-06 ENCOUNTER — Telehealth: Payer: Self-pay | Admitting: *Deleted

## 2011-03-06 NOTE — Telephone Encounter (Signed)
gave patient appointment for 06-2011 

## 2011-06-26 ENCOUNTER — Other Ambulatory Visit: Payer: BC Managed Care – PPO

## 2011-07-03 ENCOUNTER — Ambulatory Visit: Payer: BC Managed Care – PPO | Admitting: Oncology

## 2011-12-31 ENCOUNTER — Other Ambulatory Visit: Payer: Self-pay | Admitting: Orthopedic Surgery

## 2011-12-31 ENCOUNTER — Encounter (HOSPITAL_BASED_OUTPATIENT_CLINIC_OR_DEPARTMENT_OTHER): Payer: Self-pay | Admitting: *Deleted

## 2011-12-31 NOTE — Progress Notes (Signed)
np labs needed-very nervous for a trigger finger hx breast cancer

## 2012-01-02 NOTE — H&P (Signed)
  Jennifer Eaton is an 61 y.o. female.   Chief Complaint: c/o chronic and progressive STS symptoms left thumb HPI: Jennifer Eaton return in December of 2012 with a new predicament affecting her left thumb.  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  She has developed pain at her left thumb MP volar surface and triggering at the IP joint.  On palpation she has a nodule at the site of her A-1 pulley and a small lump in her flexor pollicis longus tendon that moves proximally with thumb IP flexion. She underwent 2 separate injections for the predicament which provided her with only temporary relief. She presented again in early December of 2013 with recurrent symptoms. We instructed her that she would now need to undergo release of the A-1 pulley.   Past Medical History  Diagnosis Date  . Anxiety   . Cancer     hx lt breast cancer  . Shortness of breath   . Arthritis   . Seasonal allergies   . Hyperlipemia     Past Surgical History  Procedure Date  . Abdominal hysterectomy 2000    hyst-BSO  . Breast surgery 10/11    lt lumpectomy-snbx  . Breast surgery 12/11    lt breast re-excision  . Colonoscopy   . Mouth surgery 2013  . Great toe arthrodesis, interphalangeal joint     lt great toe-plate    No family history on file. Social History:  reports that she has never smoked. She does not have any smokeless tobacco history on file. She reports that she does not drink alcohol or use illicit drugs.  Allergies:  Allergies  Allergen Reactions  . Latex Rash    No prescriptions prior to admission    No results found for this or any previous visit (from the past 48 hour(s)).  No results found.   Pertinent items are noted in HPI.  Height 5\' 5"  (1.651 m), weight 101.606 kg (224 lb).  General appearance: alert Head: Normocephalic, without obvious abnormality Neck: supple, symmetrical, trachea midline Resp: clear to auscultation  bilaterally Cardio: regular rate and rhythm GI: normal findings: bowel sounds normal Extremities:active triggering of the left thumb at the A-1 pulley. N/V intact. No other digits with triggering. Pulses: 2+ and symmetric Skin: normal Neurologic: Grossly normal    Assessment/Plan Impression:Chronic STS left thumb  Plan: To the OR for release A-1 pulley left thumb.The procedure, risks,benefits and post-op course were discussed with the patient at length and they were in agreement with the plan.   Jennifer Eaton 01/02/2012, 9:13 PM     H&P documentation: 01/03/2012  -History and Physical Reviewed  -Patient has been re-examined  -No change in the plan of care  Wyn Forster, MD

## 2012-01-03 ENCOUNTER — Ambulatory Visit (HOSPITAL_BASED_OUTPATIENT_CLINIC_OR_DEPARTMENT_OTHER): Payer: BC Managed Care – PPO | Admitting: Certified Registered Nurse Anesthetist

## 2012-01-03 ENCOUNTER — Encounter (HOSPITAL_BASED_OUTPATIENT_CLINIC_OR_DEPARTMENT_OTHER): Payer: Self-pay | Admitting: Certified Registered Nurse Anesthetist

## 2012-01-03 ENCOUNTER — Encounter (HOSPITAL_BASED_OUTPATIENT_CLINIC_OR_DEPARTMENT_OTHER): Payer: Self-pay | Admitting: *Deleted

## 2012-01-03 ENCOUNTER — Ambulatory Visit (HOSPITAL_BASED_OUTPATIENT_CLINIC_OR_DEPARTMENT_OTHER)
Admission: RE | Admit: 2012-01-03 | Discharge: 2012-01-03 | Disposition: A | Discharge status: Home / Self Care | Payer: BC Managed Care – PPO | Source: Ambulatory Visit | Attending: Orthopedic Surgery | Admitting: Orthopedic Surgery

## 2012-01-03 ENCOUNTER — Encounter (HOSPITAL_BASED_OUTPATIENT_CLINIC_OR_DEPARTMENT_OTHER)
Admission: RE | Disposition: A | Discharge status: Home / Self Care | Payer: Self-pay | Source: Ambulatory Visit | Attending: Orthopedic Surgery

## 2012-01-03 DIAGNOSIS — F411 Generalized anxiety disorder: Secondary | ICD-10-CM | POA: Insufficient documentation

## 2012-01-03 DIAGNOSIS — M6530 Trigger finger, unspecified finger: Secondary | ICD-10-CM | POA: Insufficient documentation

## 2012-01-03 DIAGNOSIS — M129 Arthropathy, unspecified: Secondary | ICD-10-CM | POA: Insufficient documentation

## 2012-01-03 DIAGNOSIS — E785 Hyperlipidemia, unspecified: Secondary | ICD-10-CM | POA: Insufficient documentation

## 2012-01-03 DIAGNOSIS — Z853 Personal history of malignant neoplasm of breast: Secondary | ICD-10-CM | POA: Insufficient documentation

## 2012-01-03 DIAGNOSIS — Z91040 Latex allergy status: Secondary | ICD-10-CM | POA: Insufficient documentation

## 2012-01-03 DIAGNOSIS — Z90710 Acquired absence of both cervix and uterus: Secondary | ICD-10-CM | POA: Insufficient documentation

## 2012-01-03 HISTORY — DX: Other seasonal allergic rhinitis: J30.2

## 2012-01-03 HISTORY — PX: TRIGGER FINGER RELEASE: SHX641

## 2012-01-03 HISTORY — DX: Anxiety disorder, unspecified: F41.9

## 2012-01-03 HISTORY — DX: Malignant (primary) neoplasm, unspecified: C80.1

## 2012-01-03 HISTORY — DX: Hyperlipidemia, unspecified: E78.5

## 2012-01-03 HISTORY — DX: Unspecified osteoarthritis, unspecified site: M19.90

## 2012-01-03 HISTORY — DX: Shortness of breath: R06.02

## 2012-01-03 SURGERY — RELEASE, A1 PULLEY, FOR TRIGGER FINGER
Anesthesia: Monitor Anesthesia Care | Site: Thumb | Laterality: Left | Wound class: Clean

## 2012-01-03 MED ORDER — LACTATED RINGERS IV SOLN
INTRAVENOUS | Status: DC
  Administered 2012-01-03: 09:00:00 via INTRAVENOUS

## 2012-01-03 MED ORDER — FENTANYL CITRATE 0.05 MG/ML IJ SOLN: 50.0000 ug | Freq: Once | INTRAMUSCULAR | Status: DC

## 2012-01-03 MED ORDER — PROPOFOL INFUSION 10 MG/ML OPTIME
INTRAVENOUS | Status: DC | PRN
  Administered 2012-01-03: 75 ug/kg/min via INTRAVENOUS

## 2012-01-03 MED ORDER — LIDOCAINE HCL (CARDIAC) 20 MG/ML IV SOLN
INTRAVENOUS | Status: DC | PRN
  Administered 2012-01-03: 60 mg via INTRAVENOUS

## 2012-01-03 MED ORDER — LIDOCAINE HCL 2 % IJ SOLN
INTRAMUSCULAR | Status: DC | PRN
  Administered 2012-01-03: 1.5 mL

## 2012-01-03 MED ORDER — FENTANYL CITRATE 0.05 MG/ML IJ SOLN: 25.0000 ug | INTRAMUSCULAR | Status: DC | PRN

## 2012-01-03 MED ORDER — ONDANSETRON HCL 4 MG/2ML IJ SOLN
INTRAMUSCULAR | Status: DC | PRN
  Administered 2012-01-03: 4 mg via INTRAVENOUS

## 2012-01-03 MED ORDER — OXYCODONE HCL 5 MG PO TABS: 5.0000 mg | ORAL_TABLET | Freq: Once | ORAL | Status: DC | PRN

## 2012-01-03 MED ORDER — MIDAZOLAM HCL 5 MG/5ML IJ SOLN
INTRAMUSCULAR | Status: DC | PRN
  Administered 2012-01-03: 1 mg via INTRAVENOUS

## 2012-01-03 MED ORDER — CHLORHEXIDINE GLUCONATE 4 % EX LIQD: 60.0000 mL | Freq: Once | CUTANEOUS | Status: DC

## 2012-01-03 MED ORDER — OXYCODONE HCL 5 MG/5ML PO SOLN: 5.0000 mg | Freq: Once | ORAL | Status: DC | PRN

## 2012-01-03 MED ORDER — FENTANYL CITRATE 0.05 MG/ML IJ SOLN
INTRAMUSCULAR | Status: DC | PRN
  Administered 2012-01-03: 25 ug via INTRAVENOUS

## 2012-01-03 MED ORDER — PROMETHAZINE HCL 25 MG/ML IJ SOLN: 6.2500 mg | INTRAMUSCULAR | Status: DC | PRN

## 2012-01-03 MED ORDER — MIDAZOLAM HCL 2 MG/2ML IJ SOLN: 1.0000 mg | INTRAMUSCULAR | Status: DC | PRN

## 2012-01-03 SURGICAL SUPPLY — 37 items
BANDAGE ADHESIVE 1X3 (GAUZE/BANDAGES/DRESSINGS) IMPLANT
BANDAGE ELASTIC 3 VELCRO ST LF (GAUZE/BANDAGES/DRESSINGS) ×2 IMPLANT
BLADE SURG 15 STRL LF DISP TIS (BLADE) ×1 IMPLANT
BLADE SURG 15 STRL SS (BLADE) ×1
BNDG ESMARK 4X9 LF (GAUZE/BANDAGES/DRESSINGS) ×2 IMPLANT
BRUSH SCRUB EZ PLAIN DRY (MISCELLANEOUS) ×2 IMPLANT
CLOTH BEACON ORANGE TIMEOUT ST (SAFETY) ×2 IMPLANT
CORDS BIPOLAR (ELECTRODE) IMPLANT
COVER MAYO STAND STRL (DRAPES) ×2 IMPLANT
COVER TABLE BACK 60X90 (DRAPES) ×2 IMPLANT
CUFF TOURNIQUET SINGLE 18IN (TOURNIQUET CUFF) ×2 IMPLANT
DECANTER SPIKE VIAL GLASS SM (MISCELLANEOUS) IMPLANT
DRAPE EXTREMITY T 121X128X90 (DRAPE) ×2 IMPLANT
DRAPE SURG 17X23 STRL (DRAPES) ×2 IMPLANT
GLOVE BIOGEL M STRL SZ7.5 (GLOVE) IMPLANT
GLOVE BIOGEL PI IND STRL 7.5 (GLOVE) ×2 IMPLANT
GLOVE BIOGEL PI INDICATOR 7.5 (GLOVE) ×2
GLOVE INDICATOR 7.0 STRL GRN (GLOVE) ×2 IMPLANT
GLOVE ORTHO TXT STRL SZ7.5 (GLOVE) IMPLANT
GOWN PREVENTION PLUS XLARGE (GOWN DISPOSABLE) ×2 IMPLANT
GOWN PREVENTION PLUS XXLARGE (GOWN DISPOSABLE) ×4 IMPLANT
NEEDLE 27GAX1X1/2 (NEEDLE) ×2 IMPLANT
PACK BASIN DAY SURGERY FS (CUSTOM PROCEDURE TRAY) ×2 IMPLANT
PADDING CAST ABS 4INX4YD NS (CAST SUPPLIES) ×1
PADDING CAST ABS COTTON 4X4 ST (CAST SUPPLIES) ×1 IMPLANT
SPONGE GAUZE 4X4 12PLY (GAUZE/BANDAGES/DRESSINGS) ×2 IMPLANT
STOCKINETTE 4X48 STRL (DRAPES) ×2 IMPLANT
STRIP CLOSURE SKIN 1/2X4 (GAUZE/BANDAGES/DRESSINGS) ×2 IMPLANT
SUT ETHILON 5 0 P 3 18 (SUTURE)
SUT NYLON ETHILON 5-0 P-3 1X18 (SUTURE) IMPLANT
SUT PROLENE 3 0 PS 2 (SUTURE) ×2 IMPLANT
SUT PROLENE 4 0 P 3 18 (SUTURE) ×2 IMPLANT
SYR 3ML 23GX1 SAFETY (SYRINGE) IMPLANT
SYR CONTROL 10ML LL (SYRINGE) ×2 IMPLANT
TRAY DSU PREP LF (CUSTOM PROCEDURE TRAY) ×2 IMPLANT
UNDERPAD 30X30 INCONTINENT (UNDERPADS AND DIAPERS) ×2 IMPLANT
WATER STERILE IRR 1000ML POUR (IV SOLUTION) IMPLANT

## 2012-01-03 NOTE — Brief Op Note (Signed)
01/03/2012  9:55 AM  PATIENT:  Alphonzo Grieve  61 y.o. female  PRE-OPERATIVE DIAGNOSIS:  LOCKING LEFT TRIGGER THUMB  POST-OPERATIVE DIAGNOSIS:  locking left trigger thumb  PROCEDURE:  Procedure(s) (LRB) with comments: RELEASE TRIGGER FINGER/A-1 PULLEY (Left)  SURGEON:  Surgeon(s) and Role:    * Wyn Forster., MD - Primary  PHYSICIAN ASSISTANT:   ASSISTANTS:Kealan Buchan Dasnoit,P.A-C   ANESTHESIA:   local and MAC  EBL:  Total I/O In: 400 [I.V.:400] Out: -   BLOOD ADMINISTERED:none  DRAINS: none   LOCAL MEDICATIONS USED:  XYLOCAINE   SPECIMEN:  No Specimen  DISPOSITION OF SPECIMEN:  N/A  COUNTS:  YES  TOURNIQUET:   Total Tourniquet Time Documented: Forearm (Left) - 5 minutes  DICTATION: .Other Dictation: Dictation Number [id number]  PLAN OF CARE: Discharge to home after PACU  PATIENT DISPOSITION:  PACU - hemodynamically stable.

## 2012-01-03 NOTE — Transfer of Care (Signed)
Immediate Anesthesia Transfer of Care Note  Patient: Jennifer Eaton  Procedure(s) Performed: Procedure(s) (LRB) with comments: RELEASE TRIGGER FINGER/A-1 PULLEY (Left)  Patient Location: PACU  Anesthesia Type:MAC  Level of Consciousness: awake, alert , oriented and patient cooperative  Airway & Oxygen Therapy: Patient Spontanous Breathing and Patient connected to face mask oxygen  Post-op Assessment: Report given to PACU RN and Post -op Vital signs reviewed and stable  Post vital signs: Reviewed and stable  Complications: No apparent anesthesia complications

## 2012-01-03 NOTE — Anesthesia Procedure Notes (Signed)
Procedure Name: MAC Performed by: Orma Cheetham D Pre-anesthesia Checklist: Patient identified, Emergency Drugs available, Suction available and Patient being monitored Patient Re-evaluated:Patient Re-evaluated prior to inductionOxygen Delivery Method: Simple face mask

## 2012-01-03 NOTE — Anesthesia Preprocedure Evaluation (Signed)
Anesthesia Evaluation  Patient identified by MRN, date of birth, ID band Patient awake    Reviewed: Allergy & Precautions, H&P , NPO status , Patient's Chart, lab work & pertinent test results  Airway Mallampati: II TM Distance: >3 FB Neck ROM: Full    Dental   Pulmonary shortness of breath,  breath sounds clear to auscultation        Cardiovascular Rhythm:Regular Rate:Normal     Neuro/Psych Anxiety    GI/Hepatic   Endo/Other    Renal/GU      Musculoskeletal   Abdominal (+) + obese,   Peds  Hematology   Anesthesia Other Findings   Reproductive/Obstetrics                           Anesthesia Physical Anesthesia Plan  ASA: II  Anesthesia Plan: MAC   Post-op Pain Management:    Induction: Intravenous  Airway Management Planned: Mask  Additional Equipment:   Intra-op Plan:   Post-operative Plan:   Informed Consent: I have reviewed the patients History and Physical, chart, labs and discussed the procedure including the risks, benefits and alternatives for the proposed anesthesia with the patient or authorized representative who has indicated his/her understanding and acceptance.     Plan Discussed with: CRNA and Surgeon  Anesthesia Plan Comments:         Anesthesia Quick Evaluation

## 2012-01-03 NOTE — Op Note (Signed)
[id number] 

## 2012-01-03 NOTE — Anesthesia Postprocedure Evaluation (Signed)
  Anesthesia Post-op Note  Patient: Jennifer Eaton  Procedure(s) Performed: Procedure(s) (LRB) with comments: RELEASE TRIGGER FINGER/A-1 PULLEY (Left)  Patient Location: PACU  Anesthesia Type:MAC  Level of Consciousness: awake  Airway and Oxygen Therapy: Patient Spontanous Breathing  Post-op Pain: mild  Post-op Assessment: Post-op Vital signs reviewed, Patient's Cardiovascular Status Stable, Respiratory Function Stable, Patent Airway, No signs of Nausea or vomiting and Pain level controlled  Post-op Vital Signs: stable  Complications: No apparent anesthesia complications

## 2012-01-06 ENCOUNTER — Encounter (HOSPITAL_BASED_OUTPATIENT_CLINIC_OR_DEPARTMENT_OTHER): Payer: Self-pay | Admitting: Orthopedic Surgery

## 2012-01-06 NOTE — Op Note (Signed)
NAMEBRINNA, DIVELBISS            ACCOUNT NO.:  [id number]  MEDICAL RECORD NO.:  [id number]  LOCATION:                                 FACILITY:  PHYSICIAN:  Katy Fitch. Cynithia Hakimi, M.D.      DATE OF BIRTH:  DATE OF PROCEDURE:  01/03/2012 DATE OF DISCHARGE:                              OPERATIVE REPORT   PREOPERATIVE DIAGNOSIS:  Locking left trigger thumb.  POSTOPERATIVE DIAGNOSIS:  Locking left trigger thumb.  OPERATION:  Release of left thumb A1 pulley.  OPERATING SURGEON:  Katy Fitch. Shakiyah Cirilo, MD  ASSISTANT:  Marveen Reeks. Dasnoit, PA-C  ANESTHESIA:  Lidocaine 2% flexor pollicis longus sheath block and thumb block left thumb.  ANESTHETIST:  Katy Fitch. Aydden Cumpian, MD  This is a minor operating room procedure.  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  She was diagnosed of stenosing tenosynovitis.  She has failed nonoperative measures.  We advised to proceed with release of left thumb A1 pulley under local anesthesia and sedation.  After informed consent, she is brought to the operating at this time.  PROCEDURE:  Jennifer Eaton is brought to room 1 of the [facility] and placed in supine position on the operating table.  Following IV sedation, the left hand and arm were prepped with Betadine soap and solution, sterilely draped.  A pneumatic tourniquet was applied to the proximal left brachium.  Lidocaine 2% was infiltrated into the path of the intended incision and the flexor pollicis longus tendon sheath.  After 5 minutes, excellent anesthesia was achieved.  The left hand and arm was then prepped with Betadine soap and solution, sterilely draped.  Following exsanguination of left arm with Esmarch bandage, arterial tourniquet to proximal brachium inflated to 220 mmHg.  Following routine surgical time-out noting that we are using latex free precautions due to allergy, we proceeded with a short  transverse incision directly over the palpably thickened A1 pulley.  Subcutaneous tissues were carefully divided, taking care to identify the radial proper digital nerve.  Four blunt Ragnell retractors were placed followed by incision of the A1 pulley with scalpel and scissors. Thereafter, full range of motion of the IP joint was recovered.  The tendons delivered with a single nerve hook and found to be otherwise normal.  The wound was repaired with intradermal 4-0 Prolene suture.  Compressive dressing was applied with Steri-Strips, sterile gauze, and Ace wrap.  Ms. Hubers has been instructed to remove her dressing in 3 days and begin using Band-Aids.  We will see her back for followup in our office in 1 week for suture removal.  For aftercare, she has demonstrated to [place] that she has adequate pain medication at home.  We will see her back for followup in 1 week or sooner p.r.n. problems.     Katy Fitch Mackayla Mullins, M.D.     RVS/MEDQ  D:  01/03/2012  T:  01/03/2012  Job:  [id number]

## 2012-07-28 ENCOUNTER — Ambulatory Visit
Admission: RE | Admit: 2012-07-28 | Discharge: 2012-07-28 | Disposition: A | Discharge status: Home / Self Care | Payer: BC Managed Care – PPO | Source: Ambulatory Visit | Attending: Otolaryngology | Admitting: Otolaryngology

## 2012-07-28 ENCOUNTER — Other Ambulatory Visit: Payer: Self-pay | Admitting: Otolaryngology

## 2012-07-28 DIAGNOSIS — K1120 Sialoadenitis, unspecified: Secondary | ICD-10-CM

## 2012-07-28 MED ORDER — IOHEXOL 300 MG/ML  SOLN
75.0000 mL | Freq: Once | INTRAMUSCULAR | Status: AC | PRN
  Administered 2012-07-28: 75 mL via INTRAVENOUS

## 2012-08-06 ENCOUNTER — Other Ambulatory Visit: Payer: Self-pay | Admitting: Otolaryngology

## 2012-08-06 DIAGNOSIS — Q892 Congenital malformations of other endocrine glands: Secondary | ICD-10-CM

## 2012-08-18 ENCOUNTER — Other Ambulatory Visit: Payer: BC Managed Care – PPO

## 2012-08-18 ENCOUNTER — Ambulatory Visit
Admission: RE | Admit: 2012-08-18 | Discharge: 2012-08-18 | Disposition: A | Discharge status: Home / Self Care | Payer: BC Managed Care – PPO | Source: Ambulatory Visit | Attending: Otolaryngology | Admitting: Otolaryngology

## 2012-08-18 DIAGNOSIS — Q892 Congenital malformations of other endocrine glands: Secondary | ICD-10-CM

## 2012-08-21 ENCOUNTER — Other Ambulatory Visit: Payer: BC Managed Care – PPO

## 2012-08-25 ENCOUNTER — Other Ambulatory Visit: Payer: BC Managed Care – PPO

## 2012-09-01 ENCOUNTER — Other Ambulatory Visit: Payer: Self-pay | Admitting: Dermatology

## 2012-09-10 HISTORY — PX: OTHER SURGICAL HISTORY: SHX169

## 2012-12-23 ENCOUNTER — Encounter: Payer: Self-pay | Admitting: Neurology

## 2012-12-23 ENCOUNTER — Encounter (INDEPENDENT_AMBULATORY_CARE_PROVIDER_SITE_OTHER): Payer: Self-pay

## 2012-12-23 ENCOUNTER — Ambulatory Visit (INDEPENDENT_AMBULATORY_CARE_PROVIDER_SITE_OTHER): Payer: BC Managed Care – PPO | Admitting: Neurology

## 2012-12-23 VITALS — BP 115/69 | HR 88 | Resp 17 | Ht 66.0 in | Wt 216.0 lb

## 2012-12-23 DIAGNOSIS — C50511 Malignant neoplasm of lower-outer quadrant of right female breast: Secondary | ICD-10-CM | POA: Insufficient documentation

## 2012-12-23 DIAGNOSIS — F988 Other specified behavioral and emotional disorders with onset usually occurring in childhood and adolescence: Secondary | ICD-10-CM | POA: Insufficient documentation

## 2012-12-23 DIAGNOSIS — R413 Other amnesia: Secondary | ICD-10-CM

## 2012-12-23 DIAGNOSIS — G808 Other cerebral palsy: Secondary | ICD-10-CM | POA: Insufficient documentation

## 2012-12-23 DIAGNOSIS — G3184 Mild cognitive impairment, so stated: Secondary | ICD-10-CM | POA: Insufficient documentation

## 2012-12-23 HISTORY — DX: Mild cognitive impairment of uncertain or unknown etiology: G31.84

## 2012-12-23 NOTE — Patient Instructions (Signed)
Mild Neurocognitive Disorder Mild neurocognitive disorder (formerly known as mild cognitive impairment) is a mental disorder. It is a slight abnormal decrease in mental function. The areas of mental function affected may include memory, thought, communication, behavior, and completion of tasks. The decrease is noticeable and measurable but does not interfere substantially with your daily activities. Mild neurocognitive disorder typically occurs in people older than 60 years but can occur earlier. It is not as serious as major neurocognitive disorder (formerly known as dementia) but may lead to a more serious neurocognitive disorder. However, in some cases the condition does not progress. A few people with mild neurocognitive disorder even improve. CAUSES  There are a number of different causes of mild neurocognitive disorder:   Brain disorders associated with abnormal protein deposits, such as Alzheimer disease, Pick disease, and Lewy body disease.  Brain disorders associated with abnormal movement, such as Parkinson disease and Huntington disease.  Diseases affecting blood vessels in the brain and resulting in mini-strokes.  Certain infections such as human immunodeficiency virus (HIV) infection.  Traumatic brain injury.  Other medical conditions such as brain tumors, underactive thyroid (hypothyroidism), and vitamin B12 deficiency.  Use of certain prescription medicine and "recreational" drugs. SYMPTOMS  Symptoms of mild neurocognitive disorder include:  Difficulty remembering You may forget details of recent events, names, or phone numbers. You may forget important social events and appointments or repeatedly forget where you put your car keys.  Difficulty thinking and solving problems You may have difficulty with complex tasks such as paying bills or driving in unfamiliar locations.  Difficulty communicating You may have difficulty finding the right word, naming an object, forming a  sentence that makes sense, or understanding what you read or hear.  Changes in your behavior or personality You may lose interest in the things that you used to enjoy or withdraw from social situations. You may get angry more easily than usual. You may act before thinking. You may do things in public that you would not usually do. You may hear or see things that are not real (hallucinations). You may believe falsely that others are trying to hurt you (paranoia). DIAGNOSIS Mild neurocognitive disorder is diagnosed through an assessment by your health care provider. Your health care provider will ask you and your family, friends, or coworkers questions about your symptoms, their frequency, their duration and progression, and the effect they are having on your life. Your health care provider may refer you to a neurologist or mental health specialist for a detailed evaluation of your mental functions (neuropsychological testing).  To identify the cause of your mild neurocognitive disorder, your health care provider may:  Obtain a detailed medical history.  Ask about alcohol and drug use, including prescription medicine.  Perform a physical exam.  Order blood tests and brain imaging exams. TREATMENT  Mild neurocognitive disorder caused by infections, use of certain medicines or recreational drugs, and certain medical conditions may improve with treatment of the condition that is causing mild neurocognitive disorder. Mild neurocognitive disorder resulting from other causes generally does not improve and may worsen. In these cases, the goal of treatment is to slow progression of the disorder and help you cope with the loss of cognitive function. Treatments in these cases include:   Medicine Medication helps mainly with memory loss and behavioral symptoms.   Talk therapy Talk therapy provides education, emotional support, memory aids, and other ways of compensating for decreases in mental function.    Lifestyle changes These include regular exercise,  a healthy diet (including essential omega-3 fatty acids), intellectual stimulation, and increased social interaction. Document Released: 09/09/2012 Document Reviewed: 06/01/2012 [facility] Patient Information 2014 [place], [place]. Cerebral Palsy Cerebral palsy (CP) is a broad term used to describe symptoms appearing in the first few years of life that impair (make difficult) control of movement and/or muscle tone.  The symptoms are caused by either faulty development or injury to the areas of the brain that control motor (movement) function and posture. Cerebral palsy may be passed on from parents (congenital) or acquired after birth. Common causes of cerebral palsy include:  Head Injury.  Meningitis.  Genetic Disorders.  Stroke before birth.  Lack of Oxygen to the Brain.  Prematurity. Cerebral palsy does not always cause profound handicap. Early signs of cerebral palsy usually appear before 32 years of age. Infants with cerebral palsy are frequently slow to reach developmental milestones. SYMPTOMS  Early symptoms  there are developmental delays in:  Rolling over.  Sitting.  Crawling.  Cruising. Later symptoms:  Varied muscle tone (from too stiff to too floppy).  Exaggerated or diminished reflexes.  Lack of muscle coordination.  Difficulty with fine motor tasks (such as writing or using scissors).  Difficulty with gross motor tasks (such as balance or walking).  Involuntary movements.  Poor control of the mouth leading to drooling, chewing and swallowing problems. The symptoms differ from person to person and may change over time. Some people with cerebral palsy are also affected by other medical problems including:   Seizures (convulsions).  Mental impairment. DIAGNOSIS  Doctors diagnose cerebral palsy by:  Testing muscle tone, motor skills and reflexes.  Medical history.  Blood tests if  necessary  Imaging of the Brain and/or Spinal Cord (head ultrasound, CT, and/or MRI). Although symptoms may change over time, cerebral palsy by definition is not progressive (does not get worse). If a patient shows worsening problems, the diagnosis may be something other than cerebral palsy. CLASSIFICATION OF CEREBRAL PALSY BY LOCATION Cerebral palsy can be classified by the number of limbs involved or by the movement. There is also a combined classification that involves a mixture of different variations of CP. About one quarter of people with CP have a mixed form of the disease.  Quadriplegia - All four of the limbs are involved.  Diplegia - The legs are involved with no arm problems.  Hemiplegia  - One side of the body is affected, usually the arm more than the leg.  Triplegia - Three limbs are involved, usually one leg and both arms.  Monoplegia - One limb is affected, usually an arm. CLASSIFICATION OF CERBRAL PALSY BY TYPE  Spastic Cerebral Palsy: This is the most common form of CP. It affects 70 - 80 percent of sufferers. The muscles are in a constant state of spasticity. Tight and stiff muscles move in a jerky motion. Spastic CP is usually due to damage to the cerebral cortex part of the brain.  Hypotonic Cerebral Palsy: Hypotonia means low muscle tone. These people have difficulty with motor delay and weakness. Hypotonic CP may be caused by injuries either to the brain or spinal cord.  Athetoid Cerebral Palsy: Athetosis leads to difficulty controlling and coordinating movement. Athetoid CP occurs when the muscle tone is mixed. Sometimes muscle tone is too high and sometimes it is too low. Involuntary writhing movements and constant motion are common to Athetoid CP. It is usually caused by damage to the basal ganglia in the midbrain.  Ataxic Cerebral Palsy: This is the  least common form of CP. This form of CP is the result of damage to the cerebellum, the brain's major center for  balance and coordination. Ataxic CP symptoms:  A disturbed sense of balance and depth perception.  Poor muscle tone.  Scanning speech (syllables are separated by pauses).  A staggering walk.  Unsteady hands.  Abnormal eye movements TREATMENT  There is no standard therapy that works for all patients. Treatment methods include:  Medications used to control seizures and muscle spasms.  Special braces to help with muscle imbalance.  Surgery  either to treat spasticity or to relax muscle tendons that are too tight.  Mechanical aids to help overcome impairments.  Counseling for emotional and psychological needs.  Physical, occupational, speech, and behavioral therapy. PROGNOSIS  At this time, cerebral palsy cannot be cured. Many patients can enjoy near-normal lives if their problems are properly managed. Document Released: 09/29/2001 Document Revised: 05/04/2012 Document Reviewed: 12/31/2007 [facility] Patient Information 2014 [place], [place].

## 2012-12-23 NOTE — Progress Notes (Signed)
[facility]  Provider:  Melvyn Novas, M D  Referring Provider: Duane Lope, MD Primary Care Physician:   Duane Lope, MD  Chief Complaint  Patient presents with  . Memory Loss    Annual F/U moca performed at today's visit    HPI:  Jennifer Eaton is a 62 y.o. female  Is seen here as a revisit  from Dr. Tenny Craw for  memory concerns, possible ADD.   The patient has right sided cerebral palsy.  She is known to have auditory processing difficulties. 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  She also has a reduced visual acuity for the right eye. She is a slightly asymmetric face. When I saw the patient last about 20 months ago she had reported that she felt more and more fatigued very tired at times she had problems speaking fluently and she felt that she was unable to compensate based on her degree of fatigue for whatever physical handicap has been present. I was worried about depression.  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 Today the patient underwent the [place] cognitive assessment down and was able to perform at Trail Making Test she was able to copy a three-dimensional cube,   clock drawing,  named 3 animals, she showed normal attention for digit span  And attention for the letter recognition, she was able to subtract serial sevens,  repeat long sentences Word for Word.  Work finding was also unrestricted as she was able to name 23 words beginning with the letter and if.  Abstraction was intact and the patient recalled 2 of a 5 immediately recalled words, she  was fully oriented to place and time and date.   She was scheduled in march 2014 for an MRI brain, and she was too claustrophobic - she left and didn't re schedule. Vit D levels were low las year , she takes OTC vit D supplement. No recent check of B12 , but several  thyroid labs were checked.    Based on her score today of 27/30 points and given her educational background,  this is a normal cognitive assessment.      She has noted a new tinnitus . In the interval her right thyroid was partially removed and she had undergone a lumpectomy on the right breast for cancer.      Review of Systems: Out of a complete 14 system review, the patient complains of only the following symptoms, and all other reviewed systems are negative. Fatigue, subjective memory loss.   History   Social History  . Marital Status: Married    Spouse Name: N/A    Number of Children: N/A  . Years of Education: N/A   Occupational History  . Not on file.   Social History Main Topics  . Smoking status: Never Smoker   . Smokeless tobacco: Not on file  . Alcohol Use: No  . Drug Use: No  . Sexual Activity: Not on file   Other Topics Concern  . Not on file   Social History Narrative  . No narrative on file    Family History  Problem Relation Age of Onset  . Coronary artery disease Father   . Seizures Brother   . Stroke Brother   . Multiple myeloma Mother   . ADD / ADHD Son  Past Medical History  Diagnosis Date  . Anxiety   . Cancer     hx lt breast cancer  . Shortness of breath   . Arthritis   . Seasonal allergies   . Hyperlipemia   . Breast cancer of lower-outer quadrant of right female breast     born after mother was treated with DHES, shortened cervix, LCIS ,   . ADD (attention deficit disorder) without hyperactivity   . Right-sided hemiplegic cerebral palsy     Past Surgical History  Procedure Laterality Date  . Abdominal hysterectomy  2000    hyst-BSO  . Breast surgery  10/11    lt lumpectomy-snbx  . Breast surgery  12/11    lt breast re-excision  . Colonoscopy    . Mouth surgery  2013  . Great toe arthrodesis, interphalangeal joint      lt great toe-plate  . Trigger finger release  01/03/2012    Procedure: RELEASE TRIGGER  FINGER/A-1 PULLEY;  Surgeon: Wyn Forster., MD;  Location: [facility];  Service: Orthopedics;  Laterality: Left;  . Thyroidglossal cyst Left 09-10-12     [facility]    Current Outpatient Prescriptions  Medication Sig Dispense Refill  . aspirin 81 MG tablet Take 81 mg by mouth daily.      . Calcium-Magnesium 500-250 MG TABS Take 2 tablets by mouth 2 (two) times daily.      . Cholecalciferol (VITAMIN D3) 1000 UNITS CAPS Take 2 capsules by mouth.      . diazepam (VALIUM) 5 MG tablet Take 5 mg by mouth at bedtime as needed.      . fish oil-omega-3 fatty acids 1000 MG capsule Take 1 g by mouth daily.      . fluticasone (FLONASE) 50 MCG/ACT nasal spray Place 2 sprays into the nose daily as needed.      Marland Kitchen letrozole (FEMARA) 2.5 MG tablet Take 2.5 mg by mouth daily.      Marland Kitchen levothyroxine (SYNTHROID, LEVOTHROID) 50 MCG tablet       . Multiple Vitamin CAPS Take by mouth.      . rosuvastatin (CRESTOR) 10 MG tablet Take 10 mg by mouth daily.      Marland Kitchen zolpidem (AMBIEN) 5 MG tablet Take 5 mg by mouth at bedtime as needed.       No current facility-administered medications for this visit.    Allergies as of 12/23/2012 - Review Complete 12/23/2012  Allergen Reaction Noted  . Lipitor [atorvastatin]  12/23/2012  . Latex Rash 12/31/2011    Vitals: BP 115/69  Pulse 88  Resp 17  Ht 5\' 6"  (1.676 m)  Wt 216 lb (97.977 kg)  BMI 34.88 kg/m2 Last Weight:  Wt Readings from Last 1 Encounters:  12/23/12 216 lb (97.977 kg)   Last Height:   Ht Readings from Last 1 Encounters:  12/23/12 5\' 6"  (1.676 m)    Physical exam:  General: The patient is awake, alert and appears not in acute distress. The patient is well groomed. Head: Normocephalic, atraumatic. Neck is supple. Mallampati 3 , neck circumference: 15. Cardiovascular:  Regular rate and rhythm , without  murmurs or carotid bruit, and without distended neck veins. Respiratory: Lungs are clear to auscultation. Skin:   Without evidence of edema, or rash Trunk: BMI is  elevated, patient  has normal posture.  Neurologic exam : The patient is awake and alert, oriented to place and time.  Memory subjective   described as lapsing ,  Amnestic spells.  There is a normal attention span & concentration ability. Speech is fluent without dysarthria, dysphonia or aphasia. Mood and affect are appropriate.  Cranial nerves: Pupils are equal and briskly reactive to light. Funduscopic exam without evidence of pallor or edema. The patient noted  Right eye decreased colour vision, for red is brown.   Extraocular movements  in vertical and horizontal planes intact and without nystagmus. No ptosis or enophthalmus.   Visual fields by finger perimetry are intact. Hearing to finger rub intact.  Facial sensation intact to fine touch.  Facial motor strength is asymmetric but  tongue and uvula move midline.  Motor exam:   Right upper extremity is smaller, shorter and weaker.  Left hip is due to overcompensating much more "worn" she reports, she limps after long standing.   Sensory:  Fine touch, pinprick and vibration were tested in all extremities. Proprioception is normal.  Coordination: Rapid alternating movements in the fingers/hands is tested and normal.  Finger-to-nose maneuver tested and normal without evidence of ataxia, dysmetria or tremor.  Gait and station: Patient walks without assistive device -unassisted  able to  climb up to the exam table.  Shoe size on the left larger than right. . Stance is stable , wider based. Tandem gait is fragmented.   Romberg testing was  normal.  Deep tendon reflexes: in the  upper and lower extremities are symmetric and intact. Babinski maneuver response is  downgoing.   Assessment:  After physical and neurologic examination, review of laboratory studies, imaging, neurophysiology testing and pre-existing records, assessment is   1) subjective cognitive impairment , likely contributed  by fatigue and  compensation for her cerebral a palsy .   I can see her recall being affected, not  any problem visual spatial or trail making.  ADD ?  Fatigue  Depression.   Plan:  Treatment plan and additional workup :  MRI reordered.  Neuropsychological evaluation if patient feels her cognitive problems progress.

## 2013-05-13 DIAGNOSIS — R413 Other amnesia: Secondary | ICD-10-CM | POA: Insufficient documentation

## 2013-05-31 ENCOUNTER — Telehealth: Payer: Self-pay | Admitting: Neurology

## 2013-05-31 NOTE — Telephone Encounter (Signed)
Patient is requesting Xanax for MRI.  Please advise.  Thank you.

## 2013-06-04 MED ORDER — ALPRAZOLAM 1 MG PO TABS: 1.0000 mg | ORAL_TABLET | Freq: Every evening | ORAL | Status: DC | PRN

## 2013-06-04 NOTE — Telephone Encounter (Signed)
Where and when MRI ? Was it done at [facility] ? Its not under images in EPIC.

## 2013-06-04 NOTE — Telephone Encounter (Signed)
MRI-  xanax sample added to med list , please give to patient f before MRI or have her pick up. Marland Kitchen

## 2013-06-04 NOTE — Telephone Encounter (Signed)
Spoke to patient and MRI was done at [facility] on Wednesday, 06-01-13.

## 2013-06-04 NOTE — Telephone Encounter (Signed)
Patient already had the MRI, said she left messages.  She received the Xanax from another doctor.  Would like to know results of MRI.

## 2013-06-04 NOTE — Telephone Encounter (Signed)
MRI done at [facility] on Wednesday, 06-02-13 not 06-01-13.

## 2013-06-07 NOTE — Telephone Encounter (Signed)
Can you get me the report???

## 2013-06-07 NOTE — Telephone Encounter (Signed)
Report seen by doctor.

## 2013-07-08 ENCOUNTER — Ambulatory Visit (INDEPENDENT_AMBULATORY_CARE_PROVIDER_SITE_OTHER): Payer: BC Managed Care – PPO | Admitting: Neurology

## 2013-07-08 ENCOUNTER — Encounter: Payer: Self-pay | Admitting: Neurology

## 2013-07-08 ENCOUNTER — Encounter (INDEPENDENT_AMBULATORY_CARE_PROVIDER_SITE_OTHER): Payer: Self-pay

## 2013-07-08 VITALS — BP 121/77 | HR 99 | Resp 16 | Ht 64.5 in | Wt 218.0 lb

## 2013-07-08 DIAGNOSIS — R930 Abnormal findings on diagnostic imaging of skull and head, not elsewhere classified: Secondary | ICD-10-CM

## 2013-07-08 DIAGNOSIS — R9089 Other abnormal findings on diagnostic imaging of central nervous system: Secondary | ICD-10-CM

## 2013-07-08 NOTE — Progress Notes (Signed)
[facility]  Provider:  Larey Seat, M D  Referring Provider: Melinda Crutch, MD Primary Care Physician:   Melinda Crutch, MD  Chief Complaint  Patient presents with  . Follow-up    Room 10  . Memory Loss    MOCA 28/30    HPI:  Jennifer Eaton is a 63 y.o. female  Is seen here as a revisit from Dr. Harrington Challenger for  memory concerns, possible ADD.  The patient has advised me of a history of right sided cerebral palsy ( diagnosed at age 54, treated with exercises ) and poor vision on the right  eye.  Her mother had ES treatments to be able to concieve ,  and  The patient had breast cancer and a short cervix. Family: strong history of cardiac disease in her brother and father, she was placed on Crestor 10 mg.  She believed that Lipitor affected her cognitive abilities.  She continues to work as a Pharmacist, hospital. She had newly noted drooling on the right angle of her mouth, but correlated this to a dental procedure. She has urinary stress incontinence.  Our last visit with a diagnosis of possible mild neurocognitive disorder the patient followed up at [facility] comes was entered and an MRI of the brain with and without contrast was obtained on 06-02-48.  The colleagues  At [facility] described the following MRI brain findings. 2  flame shaped T2 white matter hyperintensities arising  in a perpendicular fashion from the surface of the ventricles.  Small , few other lesions are seen in the region of the right basal ganglia. There is no history of topical infarct,  intracranial hemorrhage, nor  mass or mass effect and ventricles and sulci are described as normal and symmetric.   Periventricular white matter lesions are concerning for a demyelination process such as MS but they can overlap with microvascular disease. Again,  no enhancement to contrast was noted. The patient is already on aspirin and Crestor. Her brother had mini strokes and she cultured. What is  puzzling to me is that the  patient's history of cerebral palsy is not reflected in any changes or asymmetry of the brain. To work her up for possible MS would now include a CSF oligoclonal bands testing or/ and  a VEP.   Since one eye is weaker since childhood , I will not VEP. She is now willing to wait for a re-interpretaton of the [facility] MRI here.   MOCA today 28 out of 30 , points missed one of 5 recall words and the day of the week ( it's summer school break).       Last visit.  She is known to have auditory processing difficulties. 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  She also has a reduced visual acuity for the right eye. She is a slightly asymmetric face. When I saw the patient last about 20 months ago she had reported that she felt more and more fatigued very tired at times she had problems speaking fluently and she felt that she was unable to compensate based on her degree of fatigue for whatever physical handicap has been present.  I was worried about depression.  Today the patient underwent  again the [facility] cognitive assessment down and was able to perform at [place] she was able to copy a three-dimensional cube,   clock  drawing,  named 3 animals, she showed normal attention for digit span  And attention for the letter recognition, she was able to subtract serial sevens,  repeat long sentences Word for Word.  Work finding was also unrestricted as she was able to name 23 words beginning with the letter and if.  Abstraction was intact and the patient recalled 2 of a 5 immediately recalled words, she  was fully oriented to place and time and date.  She was scheduled in march 2014 for an MRI brain, and she was too claustrophobic - she left and didn't re schedule. Vit D levels were low las year , she takes OTC vit D supplement. No recent check of B12 , but several thyroid labs were checked.   Based on her score today of 27/30 points and given her educational background, this is a normal cognitive assessment. In the interval her right thyroid was partially removed and she had undergone a lumpectomy on the right breast for cancer.      Review of Systems: Out of a complete 14 system review, the patient complains of only the following symptoms, and all other reviewed systems are negative. Fatigue, subjective memory loss.   History   Social History  . Marital Status: Married    Spouse Name: [name]    Number of Children: 2  . Years of Education: College   Occupational History  . [employer]   Social History Main Topics  . Smoking status: Never Smoker   . Smokeless tobacco: Never Used  . Alcohol Use: No  . Drug Use: No  . Sexual Activity: Not on file   Other Topics Concern  . Not on file   Social History Narrative   Patient is married [name]) and lives at home with her husband.   Patient has two children.   Patient is a Pharmacist, hospital.   Patient has a college education.   Patient is left-handed.   Patient drinks one cup of coffee every morning.    Family History  Problem Relation Age of Onset  . Coronary artery disease Father   . Seizures Brother   . Stroke Brother   . Multiple myeloma Mother   . ADD / ADHD Son     Past Medical History  Diagnosis Date  . Anxiety   . Cancer     hx lt breast cancer  . Shortness of breath   . Arthritis   . Seasonal allergies   . Hyperlipemia   . Breast cancer of lower-outer quadrant of right female breast     born after mother was treated with DHES, shortened cervix, LCIS ,   . ADD (attention deficit disorder) without hyperactivity   . Right-sided hemiplegic cerebral palsy   . 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 Abdominal hysterectomy  2000    hyst-BSO  . Breast surgery  10/11    lt lumpectomy-snbx  . Breast  surgery  12/11    lt breast re-excision  . Colonoscopy    . Mouth surgery  2013  . Great toe arthrodesis, interphalangeal joint      lt great toe-plate  . Trigger finger release  01/03/2012    Procedure: RELEASE TRIGGER FINGER/A-1 PULLEY;  Surgeon: Cammie Sickle., MD;  Location: [facility];  Service: Orthopedics;  Laterality: Left;  . Thyroidglossal cyst Left 09-10-12     [facility]  Current Outpatient Prescriptions  Medication Sig Dispense Refill  . ALPRAZolam (XANAX) 1 MG tablet Take 1 tablet (1 mg total) by mouth at bedtime as needed for anxiety.  2 tablet  0  . aspirin 81 MG tablet Take 81 mg by mouth daily.      . Cholecalciferol (VITAMIN D3) 1000 UNITS CAPS Take 1 capsule by mouth.       . co-enzyme Q-10 50 MG capsule Take 50 mg by mouth daily.      . diazepam (VALIUM) 5 MG tablet 5 mg. As needed for medical procedures.      . diphenhydrAMINE (BENADRYL) 25 mg capsule Take 25 mg by mouth every 6 (six) hours as needed.      . fish oil-omega-3 fatty acids 1000 MG capsule Take 1 g by mouth daily.      . fluticasone (FLONASE) 50 MCG/ACT nasal spray Place 2 sprays into the nose daily as needed.      Marland Kitchen letrozole (FEMARA) 2.5 MG tablet Take 2.5 mg by mouth daily.      Marland Kitchen levothyroxine (SYNTHROID) 88 MCG tablet Take 88 mcg by mouth daily before breakfast. Taking BRAND MEDICATION      . Multiple Vitamin CAPS Take by mouth.      . Probiotic Product (PROBIOTIC DAILY PO) Take 1 tablet by mouth daily. [place]      . rosuvastatin (CRESTOR) 10 MG tablet Take 10 mg by mouth daily.      Marland Kitchen zolpidem (AMBIEN) 5 MG tablet Take 5 mg by mouth at bedtime as needed.       No current facility-administered medications for this visit.    Allergies as of 07/08/2013 - Review Complete 07/08/2013  Allergen Reaction Noted  . Cephalexin Hives 07/08/2013  . Lipitor [atorvastatin]  12/23/2012  . Vancomycin Hives 07/08/2013  . Latex Rash 12/31/2011    Vitals: BP 121/77   Pulse 99  Resp 16  Ht 5' 4.5" (1.638 m)  Wt 218 lb (98.884 kg)  BMI 36.86 kg/m2 Last Weight:  Wt Readings from Last 1 Encounters:  07/08/13 218 lb (98.884 kg)   Last Height:   Ht Readings from Last 1 Encounters:  07/08/13 5' 4.5" (1.638 m)    Physical exam:  General: The patient is awake, alert and appears not in acute distress. The patient is well groomed. Head: Normocephalic, atraumatic. Neck is supple. Mallampati 3 , neck circumference: 16. Thyroid surgery scar anterior. Well healed, NO TMJ click.  Cardiovascular:  Regular rate and rhythm , without  murmurs or carotid bruit, and without distended neck veins. Respiratory: Lungs are clear to auscultation. Skin:  Without evidence of edema, or rash Trunk: BMI is  Elevated.  Neurologic exam : The patient is awake and alert, oriented to place and time.  Memory subjective   described as lapsing ,  Amnestic spells.  There is a normal attention span & concentration ability. Speech is fluent without dysarthria, dysphonia or aphasia. Mood and affect are appropriate.  Cranial nerves: Pupils are equal and briskly reactive to light.  Funduscopic exam without evidence of pallor or edema. The patient noted  Right eye decreased colour vision, for red is brown.   Extraocular movements  in vertical and horizontal planes intact and without nystagmus. Right Eye is slightly more narrowed,  No enophthalmus.   Visual fields by finger perimetry are intact. Hearing to finger rub intact. Facial sensation intact to fine touch.  Facial motor strength is asymmetric but  tongue and uvula move midline.  Motor exam:   Right upper extremity is smaller, shorter and weaker. A finding seen in intrauterine strokes, cerebral palsy, Erb's plexopathy and in post polio.   Right foot is smaller by  half shoe size.   Left hip is overcompensating- she reports she is  much more "worn" and  she limps after long standing.   Sensory:  Fine touch, pinprick and vibration  were tested in all extremities. Proprioception is normal.  Coordination: Rapid alternating movements in the fingers/hands is tested and normal.  Finger-to-nose maneuver tested and normal without evidence of ataxia, dysmetria or tremor.  Gait and station: Patient walks without assistive device -unassisted. She is able to climb up to the exam table.  Shoe size on the left larger than right.  Stance is stable , but wide based. Tandem gait is fragmented.   Romberg testing was normal.  Deep tendon reflexes: in the  upper and lower extremities are symmetric and intact. Babinski maneuver response is  downgoing.   Assessment:  After physical and neurologic examination, review of laboratory studies, imaging, neurophysiology testing and pre-existing records, assessment is   1) subjective cognitive impairment ,  No  problem on MOCA at 28-30 , not visual spatial nor with trail making.  ADD  Fatigue  Depression.   Plan:  Treatment plan and additional workup :  MRI review through Dr. Leta Baptist or Leonie Man , CD in copy. She has no objective memory impairment, and the MRI did not mentioned any CP.  Two flame shaped lesions were noted on MRI, but she does not present with clinical MS . VEP not clinically valid.  CSF testing appears to be an overreach.   Neuropsychological evaluation if patient feels that her cognitive problems progress. Child and brother have ADHD.

## 2013-07-21 ENCOUNTER — Other Ambulatory Visit (HOSPITAL_COMMUNITY): Payer: Self-pay | Admitting: Family Medicine

## 2013-07-21 DIAGNOSIS — E782 Mixed hyperlipidemia: Secondary | ICD-10-CM

## 2013-08-03 ENCOUNTER — Ambulatory Visit (HOSPITAL_COMMUNITY): Payer: BC Managed Care – PPO

## 2013-11-01 ENCOUNTER — Telehealth: Payer: Self-pay | Admitting: *Deleted

## 2013-11-01 NOTE — Telephone Encounter (Signed)
Closed previous encounter in error please see previous phone note

## 2013-11-01 NOTE — Telephone Encounter (Signed)
Patient calling to make sure her CD has arrived from [place]. Per patient she states that Dr Brett Fairy wanted to see the report.

## 2013-11-02 ENCOUNTER — Telehealth: Payer: Self-pay | Admitting: Neurology

## 2013-11-02 ENCOUNTER — Telehealth: Payer: Self-pay | Admitting: *Deleted

## 2013-11-02 NOTE — Telephone Encounter (Signed)
MRI the brain report received from [facility], indicates periventricular white matter lesions consistent with demyelination without enhancement. 2 flame-shaped T2 white matter lesions seen for radicular to the ventricles.

## 2013-11-02 NOTE — Telephone Encounter (Signed)
Requested MRI cd and report from [facility], second time I have sent release 11-02-13.

## 2013-11-02 NOTE — Telephone Encounter (Signed)
The CD ROM has not been reviewed by me- was it send to my name? Please find the disc, tashia , if you can . Thank you. CD

## 2013-11-03 NOTE — Telephone Encounter (Signed)
Disc on your desk.

## 2013-11-03 NOTE — Telephone Encounter (Signed)
Received MRI Disc of brain from [facility] 11-03-13

## 2013-11-09 NOTE — Telephone Encounter (Signed)
Thank you, tashia, reviewed and attempted to call patient , no success.

## 2013-11-11 ENCOUNTER — Telehealth: Payer: Self-pay | Admitting: Neurology

## 2013-11-11 ENCOUNTER — Encounter: Payer: Self-pay | Admitting: Neurology

## 2013-11-11 ENCOUNTER — Ambulatory Visit (INDEPENDENT_AMBULATORY_CARE_PROVIDER_SITE_OTHER): Payer: BC Managed Care – PPO | Admitting: Neurology

## 2013-11-11 VITALS — BP 130/77 | HR 85 | Temp 98.4°F | Resp 14 | Ht 65.5 in | Wt 218.0 lb

## 2013-11-11 DIAGNOSIS — Z9189 Other specified personal risk factors, not elsewhere classified: Secondary | ICD-10-CM | POA: Insufficient documentation

## 2013-11-11 DIAGNOSIS — F988 Other specified behavioral and emotional disorders with onset usually occurring in childhood and adolescence: Secondary | ICD-10-CM

## 2013-11-11 DIAGNOSIS — F900 Attention-deficit hyperactivity disorder, predominantly inattentive type: Secondary | ICD-10-CM

## 2013-11-11 DIAGNOSIS — G809 Cerebral palsy, unspecified: Secondary | ICD-10-CM | POA: Insufficient documentation

## 2013-11-11 NOTE — Telephone Encounter (Signed)
Information requested was mailed to the patient. Thanks Angie

## 2013-11-11 NOTE — Progress Notes (Signed)
[facility]  Provider:  Larey Eaton, M D  Referring Provider: Melinda Crutch, MD Primary Care Physician:   Jennifer Crutch, MD  Chief Complaint  Patient presents with  . RV memory    Rm 10, alone    HPI:  Jennifer Eaton is a 63 y.o. female  Is seen here as a revisit from Jennifer Eaton for memory concerns, possible ADD. We reviewed the 2 MRI films on a disc, there is no evidence of cerebral palsy, but one central left hemispheric , flame shaped lesion arising form the lateral ventricle towards the central white matter, no atrophy of the hippocampi, no acute , enhancing lesions.  MOCA 29-30, no evidence of cognitive impairment.  She noted some drooling on the left side of the mouth, inside the buccal tissue she feels numbness, since a dental implant.  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  She has 2 nieces, a brother and a son with ADD.     Last visits notes, CD  The patient has advised me of a history of right sided cerebral palsy ( diagnosed at age 63, treated with exercises ) and poor vision on the right  eye.  Her mother had ES treatments to be able to concieve ,  and  The patient had breast cancer and a short cervix. Family: strong history of cardiac disease in her brother and father, she was placed on Crestor 10 mg.  She believed that Lipitor affected her cognitive abilities.  She continues to work as a Pharmacist, hospital. She had newly noted drooling on the right angle of her mouth, but correlated this to a dental procedure. She has urinary stress incontinence.  Our last visit with a diagnosis of possible mild neurocognitive disorder the patient followed up at [facility] comes was entered and an MRI of the brain with and without contrast was obtained on 06-02-48.  The colleagues  At [facility] described the following MRI brain findings. 2 flame shaped T2 white matter hyperintensities arising  in a perpendicular fashion from  the surface of the ventricles.  Small , few other lesions are seen in the region of the right basal ganglia. There is no history of topical infarct,  intracranial hemorrhage, nor  mass or mass effect and ventricles and sulci are described as normal and symmetric.   Periventricular white matter lesions are concerning for a demyelination process such as MS but they can overlap with microvascular disease. Again,  no enhancement to contrast was noted. The patient is already on aspirin and Crestor. Her brother had mini strokes and she cultured. What is  puzzling to me is that the patient's history of cerebral palsy is not reflected in any changes or asymmetry of the brain. To work her up for possible MS would now include a CSF oligoclonal bands testing or/ and  a VEP.   Since one eye is weaker since childhood , I will not use  VEP. She is now willing to wait for a re-interpretaton of the [facility] MRI here.   MOCA today 29 out of 30 ,  Was 28-30 6 month ago. She missed one of 5 recall words and the day of the week ( it's summer school break).  She is known to have auditory processing difficulties. 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  She also has a reduced visual acuity for the right eye. She is a slightly asymmetric face. When I saw the patient last about 20 months ago she had reported that she felt more and more fatigued very tired at times she had problems speaking fluently and she felt that she was unable to compensate based on her degree of fatigue for whatever physical handicap has been present.  I was worried about depression. Today the patient underwent  again the [facility] cognitive assessment down and was able to perform at [facility].  She was able to copy a three-dimensional cube,  clock drawing, named 3 animals, she showed normal attention for digit span  And attention for the letter  recognition, she was able to subtract serial sevens,  repeat long sentences Word for Word.  Work finding was also unrestricted as she was able to name 23 words beginning with the letter and if.  Abstraction was intact and the patient recalled 2 of a 5 immediately recalled words, she  was fully oriented to place and time and date.  She was scheduled in march 2014 for an MRI brain, and she was too claustrophobic - she left and didn't re schedule. Vit D levels were low las year , she takes OTC vit D supplement. No recent check of B12 , but several thyroid labs were checked.  Based on her score today of 27/30 points and given her educational background, this is a normal cognitive assessment. In the interval her right thyroid was partially removed and she had undergone a lumpectomy on the right breast for cancer.      Review of Systems: Out of a complete 14 system review, the patient complains of only the following symptoms, and all other reviewed systems are negative. Fatigue, subjective memory loss.   History   Social History  . Marital Status: Married    Spouse Name: Jennifer Eaton    Number of Children: 2  . Years of Education: College   Occupational History  . [employer]   Social History Main Topics  . Smoking status: Never Smoker   . Smokeless tobacco: Never Used  . Alcohol Use: No  . Drug Use: No  . Sexual Activity: Not on file   Other Topics Concern  . Not on file   Social History Narrative   Patient is married Jennifer Eaton) and lives at home with her husband.   Patient has two children.   Patient is a Pharmacist, hospital.   Patient has a college education.   Patient is left-handed.   Patient drinks one cup of coffee every morning.    Family History  Problem Relation Age of Onset  . Coronary artery disease Father   . Seizures Brother   . Stroke Brother   . Multiple myeloma Mother   . ADD / ADHD Son     Past Medical History  Diagnosis Date  . Anxiety   . Cancer      hx lt breast cancer  . Shortness of breath   . Arthritis   . Seasonal allergies   . Hyperlipemia   . Breast cancer of lower-outer quadrant of right female breast     born after mother was treated with DHES, shortened cervix, LCIS ,   . ADD (attention deficit disorder) without hyperactivity   . Right-sided hemiplegic cerebral palsy   . 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  Abdominal hysterectomy  2000    hyst-BSO  . Breast surgery  10/11    lt lumpectomy-snbx  . Breast surgery  12/11    lt breast re-excision  . Colonoscopy    . Mouth surgery  2013  . Great toe arthrodesis, interphalangeal joint      lt great toe-plate  . Trigger finger release  01/03/2012    Procedure: RELEASE TRIGGER FINGER/A-1 PULLEY;  Surgeon: Cammie Sickle., MD;  Location: [place];  Service: Orthopedics;  Laterality: Left;  . Thyroidglossal cyst Left 09-10-12     [facility]    Current Outpatient Prescriptions  Medication Sig Dispense Refill  . aspirin 81 MG tablet Take 81 mg by mouth daily.      . Cholecalciferol (VITAMIN D3) 1000 UNITS CAPS Take 2 capsules by mouth.       . co-enzyme Q-10 50 MG capsule Take 100 mg by mouth daily.       . diphenhydrAMINE (BENADRYL) 25 mg capsule Take 25 mg by mouth every 6 (six) hours as needed.      . fish oil-omega-3 fatty acids 1000 MG capsule Take 1 g by mouth daily.      Marland Kitchen ibuprofen (ADVIL,MOTRIN) 200 MG tablet Take 200 mg by mouth every 6 (six) hours as needed.      Marland Kitchen letrozole (FEMARA) 2.5 MG tablet Take 2.5 mg by mouth daily.      Marland Kitchen levothyroxine (SYNTHROID) 88 MCG tablet Take 88 mcg by mouth daily before breakfast. Taking BRAND MEDICATION      . Multiple Vitamin CAPS Take by mouth.      . Probiotic Product (PROBIOTIC DAILY PO) Take 1 tablet by mouth daily. Cutrell      . rosuvastatin (CRESTOR) 10 MG tablet Take 10 mg by mouth daily.      Marland Kitchen  zolpidem (AMBIEN) 5 MG tablet Take 5 mg by mouth at bedtime as needed.      . diazepam (VALIUM) 5 MG tablet 5 mg. As needed for medical procedures.      . fluticasone (FLONASE) 50 MCG/ACT nasal spray Place 2 sprays into the nose daily as needed.       No current facility-administered medications for this visit.    Allergies as of 11/11/2013 - Review Complete 11/11/2013  Allergen Reaction Noted  . Cephalexin Hives 07/08/2013  . Lipitor [atorvastatin]  12/23/2012  . Vancomycin Hives 07/08/2013  . Latex Rash 12/31/2011    Vitals: BP 130/77  Pulse 85  Temp(Src) 98.4 F (36.9 C) (Oral)  Resp 14  Ht 5' 5.5" (1.664 m)  Wt 218 lb (98.884 kg)  BMI 35.71 kg/m2 Last Weight:  Wt Readings from Last 1 Encounters:  11/11/13 218 lb (98.884 kg)   Last Height:   Ht Readings from Last 1 Encounters:  11/11/13 5' 5.5" (1.664 m)    Physical exam:  General: The patient is awake, alert and appears not in acute distress. The patient is well groomed. Head: Normocephalic, atraumatic. Neck is supple. Mallampati 3 , neck circumference: 16. Thyroid surgery scar anterior. Well healed, NO TMJ click.  Cardiovascular:  Regular rate and rhythm , without  murmurs or carotid bruit, and without distended neck veins. Respiratory: Lungs are clear to auscultation. Skin:  Without evidence of edema, or rash Trunk: BMI is  Elevated.  Neurologic exam : The patient is awake and alert, oriented to place and time.  Memory subjective   described as lapsing ,  Amnestic spells.  There  is a normal attention span & concentration ability. Speech is fluent without dysarthria, dysphonia or aphasia. Mood and affect are appropriate.  Cranial nerves: Pupils are equal and briskly reactive to light.  Funduscopic exam without evidence of pallor or edema. The patient noted  Right eye decreased colour vision, for red is brown.   Extraocular movements  in vertical and horizontal planes intact and without nystagmus. Right Eye is  slightly more narrowed,  No enophthalmus.   Visual fields by finger perimetry are intact. Hearing to finger rub intact. Facial sensation intact to fine touch.  Facial motor strength is asymmetric but  tongue and uvula move midline.  Motor exam:   Right upper extremity is smaller, shorter and weaker. A finding seen in intrauterine strokes, cerebral palsy, Erb's plexopathy and in post polio.   Right foot is smaller by  half shoe size.   Left hip is overcompensating- she reports she is  much more "worn" and  she limps after long standing.   Sensory:  Fine touch, pinprick and vibration were tested in all extremities. Proprioception is normal.  Coordination: Rapid alternating movements in the fingers/hands is tested and normal.  Finger-to-nose maneuver tested and normal without evidence of ataxia, dysmetria or tremor.  Gait and station: Patient walks without assistive device -unassisted. She is able to climb up to the exam table.  Shoe size on the left larger than right.  Stance is stable , but wide based. Tandem gait is fragmented.   Romberg testing was normal.  Deep tendon reflexes: in the  upper and lower extremities are symmetric and intact. Babinski maneuver response is  downgoing.   Assessment:  After physical and neurologic examination, review of laboratory studies, imaging, neurophysiology testing and pre-existing records, assessment is   1) subjective cognitive impairment ,  No  problem on MOCA at 28-30 , not visual spatial nor with trail making.  ADD  Fatigue  Depression.   Plan:  Treatment plan and additional workup :  MRI review through Dr. Leta Baptist or Leonie Man , CD in copy. She has no objective memory impairment, and the MRI did not mentioned any CP.  Two flame shaped lesions were noted on MRI, but she does not present with clinical MS . VEP not clinically valid.  CSF testing appears to be an overreach.   Neuropsychological evaluation if patient feels that her cognitive  problems progress. Child and brother have ADHD.

## 2013-12-08 ENCOUNTER — Encounter: Payer: Self-pay | Admitting: Neurology

## 2013-12-14 ENCOUNTER — Encounter: Payer: Self-pay | Admitting: Neurology

## 2014-09-27 ENCOUNTER — Telehealth: Payer: Self-pay | Admitting: *Deleted

## 2014-09-27 NOTE — Telephone Encounter (Signed)
called for med rec on this pt, after hours and could not leave a VM. left a note for rose jacobs in CP & ask her to contact [facility] tomorrow for med recs.

## 2014-09-29 ENCOUNTER — Ambulatory Visit (INDEPENDENT_AMBULATORY_CARE_PROVIDER_SITE_OTHER): Payer: BC Managed Care – PPO | Admitting: Interventional Cardiology

## 2014-09-29 ENCOUNTER — Encounter: Payer: Self-pay | Admitting: Interventional Cardiology

## 2014-09-29 VITALS — BP 118/58 | HR 62 | Ht 65.0 in | Wt 171.4 lb

## 2014-09-29 DIAGNOSIS — E785 Hyperlipidemia, unspecified: Secondary | ICD-10-CM | POA: Diagnosis not present

## 2014-09-29 DIAGNOSIS — R011 Cardiac murmur, unspecified: Secondary | ICD-10-CM | POA: Diagnosis not present

## 2014-09-29 DIAGNOSIS — Z8249 Family history of ischemic heart disease and other diseases of the circulatory system: Secondary | ICD-10-CM | POA: Insufficient documentation

## 2014-09-29 NOTE — Progress Notes (Signed)
Patient ID: Jennifer Eaton, female   DOB: 1950-02-22, 64 y.o.   MRN: [id number]     Cardiology Office Note   Date:  09/29/2014   ID:  Jennifer Eaton, DOB 1950/05/28, MRN [id number]  PCP:   Melinda Crutch, MD    No chief complaint on file. eval murmur    Wt Readings from Last 3 Encounters:  09/29/14 171 lb 6.4 oz (77.747 kg)  11/11/13 218 lb (98.884 kg)  07/08/13 218 lb (98.884 kg)       History of Present Illness: Jennifer Eaton is a 64 y.o. female  Who has a family h/o heart disease.  Her father had an MI in his 48s but he was a heavy smoker.  Mother died from Multiple myeloma.  Her brother has had a heart catheterization without any known revascularization.  She was found to have a heart murmur several years ago. It seemed to have resolved but now has come back on exam with her primary care physician. She is here for further evaluation. She has lost weight in the last few months. She exercises regularly on the treadmill. She denies any chest discomfort or shortness of breath. Overall, she is feeling quite well.  No  Excessive sweating, nausea, palpitations.    Past Medical History  Diagnosis Date  . Anxiety   . Cancer     hx lt breast cancer  . Shortness of breath   . Arthritis   . Seasonal allergies   . Hyperlipemia   . Breast cancer of lower-outer quadrant of right female breast     born after mother was treated with DHES, shortened cervix, LCIS ,   . ADD (attention deficit disorder) without hyperactivity   . Right-sided hemiplegic cerebral palsy   . 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 Abdominal hysterectomy  2000    hyst-BSO  . Breast surgery  10/11    lt lumpectomy-snbx  . Breast surgery  12/11    lt breast re-excision  . Colonoscopy    . Mouth surgery  2013  . Great toe arthrodesis, interphalangeal joint      lt great toe-plate  . Trigger finger release   01/03/2012    Procedure: RELEASE TRIGGER FINGER/A-1 PULLEY;  Surgeon: Cammie Sickle., MD;  Location: [place];  Service: Orthopedics;  Laterality: Left;  . Thyroidglossal cyst Left 09-10-12     [facility]     Current Outpatient Prescriptions  Medication Sig Dispense Refill  . aspirin 81 MG tablet Take 81 mg by mouth daily.    . Biotin 5 MG CAPS Take 1 capsule by mouth daily.    . Cholecalciferol 2000 UNITS CAPS Take 2 capsules by mouth daily.    Marland Kitchen co-enzyme Q-10 50 MG capsule Take 50 mg by mouth daily.     . diazepam (VALIUM) 5 MG tablet Take 5 mg by mouth every 8 (eight) hours as needed for anxiety. As needed for medical procedures.    . diphenhydrAMINE (BENADRYL) 25 mg capsule Take 25 mg by mouth every 6 (six) hours as needed (cold).     . fish oil-omega-3 fatty acids 1000 MG capsule Take 1 g by mouth daily.    . fluticasone (FLONASE) 50 MCG/ACT nasal spray Place 2 sprays into the nose daily as needed (cold).     Marland Kitchen ibuprofen (ADVIL,MOTRIN) 200 MG tablet Take 200 mg by mouth every  6 (six) hours as needed (pain).     Marland Kitchen letrozole (FEMARA) 2.5 MG tablet Take 2.5 mg by mouth daily.    Marland Kitchen levothyroxine (SYNTHROID) 100 MCG tablet Take 1 tablet by mouth daily before breakfast.     . Multiple Vitamin CAPS Take 1 capsule by mouth daily.     . Probiotic Product (PROBIOTIC DAILY PO) Take 1 tablet by mouth daily. Cutrell    . rosuvastatin (CRESTOR) 10 MG tablet Take 10 mg by mouth daily.    . sertraline (ZOLOFT) 25 MG tablet Take 1 tablet by mouth daily.    Marland Kitchen zolpidem (AMBIEN) 10 MG tablet Take 5 mg by mouth at bedtime as needed for sleep.   0   No current facility-administered medications for this visit.    Allergies:   Cephalexin; Lipitor; Vancomycin; and Latex    Social History:  The patient  reports that she has never smoked. She has never used smokeless tobacco. She reports that she does not drink alcohol or use illicit drugs.   Family History:  The  patient's family history includes ADD / ADHD in her son; Coronary artery disease in her father; Multiple myeloma in her mother; Seizures in her brother; Stroke in her brother.    ROS:  Please see the history of present illness.   Otherwise, review of systems are positive for chronic right sided weakness.   All other systems are reviewed and negative.    PHYSICAL EXAM: VS:  BP 118/58 mmHg  Pulse 62  Ht 5' 5"  (1.651 m)  Wt 171 lb 6.4 oz (77.747 kg)  BMI 28.52 kg/m2 , BMI Body mass index is 28.52 kg/(m^2). GEN: Well nourished, well developed, in no acute distress HEENT: normal Neck: no JVD, carotid bruits, or masses Cardiac: RRR; 1/6 early systolic murmurs, no rubs, or gallops,no edema  Respiratory:  clear to auscultation bilaterally, normal work of breathing GI: soft, nontender, nondistended, + BS MS: no deformity or atrophy; right leg slightly smaller Skin: warm and dry, no rash Neuro:  Strength and sensation are intact Psych: euthymic mood, full affect   EKG:   The ekg ordered today demonstrates normal ECG   Recent Labs: No results found for requested labs within last 365 days.   Lipid Panel No results found for: CHOL, TRIG, HDL, CHOLHDL, VLDL, LDLCALC, LDLDIRECT   Other studies Reviewed: Additional studies/ records that were reviewed today with results demonstrating: Lipids reviewed. LDL 66 at last check..   ASSESSMENT AND PLAN:  1. Systolic murmur: Check echocardiogram to evaluate for structural heart disease.  Does not sound like a dangerous murmur.   2. Family history of early coronary artery disease: She is concerned and interested in screening. She had read about a cardiac CT.  At this point, I think an exercise treadmill test would be most useful to evaluate her functional capacity. Will schedule. 3. Hyperlipidemia: LDL well below target. Will decrease Crestor to 5 mg daily. She would like to see how she does on the lowest possible dose. She can follow-up with Dr.  Harrington Challenger to have her lipids rechecked. Even if her LDL is around 100, that would be more than adequate primary prevention.   Current medicines are reviewed at length with the patient today.  The patient concerns regarding her medicines were addressed.  The following changes have been made:  Decrease Crestor  Labs/ tests ordered today include:  No orders of the defined types were placed in this encounter.    Recommend 150 minutes/week of  aerobic exercise Low fat, low carb, high fiber diet recommended  Disposition:   FU for  tests   Teresita Madura., MD  09/29/2014 11:36 AM    [facility] [address], [place], [place]  [address] Phone: [phone]; Fax: [phone]

## 2014-09-29 NOTE — Patient Instructions (Addendum)
Medication Instructions:   START TAKING CRESTOR 5 MG ONCE A DAY    Labwork:  NONE ORDER TODAY    Testing/Procedures:  Your physician has requested that you have an echocardiogram. Echocardiography is a painless test that uses sound waves to create images of your heart. It provides your doctor with information about the size and shape of your heart and how well your heart's chambers and valves are working. This procedure takes approximately one hour. There are no restrictions for this procedure.  Your physician has requested that you have an exercise tolerance test. For further information please visit [email or web address]. Please also follow instruction sheet, as given.     Follow-Up:  WILL BE MADE BASED UPON RESULTS OF TESTS    Any Other Special Instructions Will Be Listed Below (If Applicable).

## 2014-10-05 ENCOUNTER — Other Ambulatory Visit: Payer: Self-pay

## 2014-10-05 ENCOUNTER — Ambulatory Visit: Payer: BC Managed Care – PPO | Admitting: Cardiology

## 2014-10-05 ENCOUNTER — Ambulatory Visit (HOSPITAL_COMMUNITY): Payer: BC Managed Care – PPO | Attending: Cardiology

## 2014-10-05 DIAGNOSIS — I340 Nonrheumatic mitral (valve) insufficiency: Secondary | ICD-10-CM | POA: Insufficient documentation

## 2014-10-05 DIAGNOSIS — I5189 Other ill-defined heart diseases: Secondary | ICD-10-CM | POA: Insufficient documentation

## 2014-10-05 DIAGNOSIS — E785 Hyperlipidemia, unspecified: Secondary | ICD-10-CM | POA: Diagnosis not present

## 2014-10-05 DIAGNOSIS — R011 Cardiac murmur, unspecified: Secondary | ICD-10-CM

## 2014-10-24 ENCOUNTER — Other Ambulatory Visit: Payer: Self-pay | Admitting: *Deleted

## 2014-10-24 DIAGNOSIS — Z8249 Family history of ischemic heart disease and other diseases of the circulatory system: Secondary | ICD-10-CM

## 2014-10-25 ENCOUNTER — Ambulatory Visit (INDEPENDENT_AMBULATORY_CARE_PROVIDER_SITE_OTHER): Payer: BC Managed Care – PPO

## 2014-10-25 ENCOUNTER — Encounter: Payer: BC Managed Care – PPO | Admitting: Nurse Practitioner

## 2014-10-25 DIAGNOSIS — Z8249 Family history of ischemic heart disease and other diseases of the circulatory system: Secondary | ICD-10-CM | POA: Diagnosis not present

## 2014-10-25 LAB — EXERCISE TOLERANCE TEST
CHL CUP RESTING HR STRESS: 66 {beats}/min
CHL RATE OF PERCEIVED EXERTION: 17
CSEPED: 7 min
CSEPEDS: 0 s
Estimated workload: 8.5 METS
MPHR: 156 {beats}/min
Peak HR: 151 {beats}/min
Percent HR: 97 %

## 2015-01-31 ENCOUNTER — Other Ambulatory Visit: Payer: Self-pay | Admitting: Dermatology

## 2015-04-14 IMAGING — CT CT NECK W/ CM
4 of 6 series · 14 of 33 positions shown, 16 images · IV contrast (75CC OMNI 300)
Comparison: None.

CLINICAL DATA: Sialoadenitis.  Submental swelling and redness.
Fever.

CT NECK WITH CONTRAST
TECHNIQUE: Multidetector CT imaging of the neck was performed with
intravenous contrast.
Contrast: 75mL OMNIPAQUE IOHEXOL 300 MG/ML  SOLN

[Series 2: axial neck · axial · 0.43mm/px · z∈[-6,+112]mm · 3 of 95 slices shown]
[im 24/95  bone]
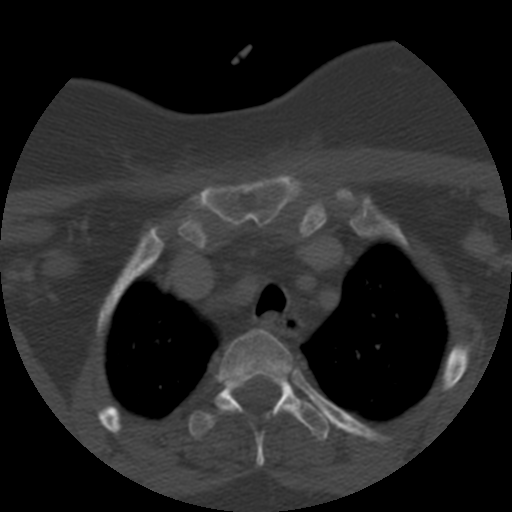
[im 48/95  bone]
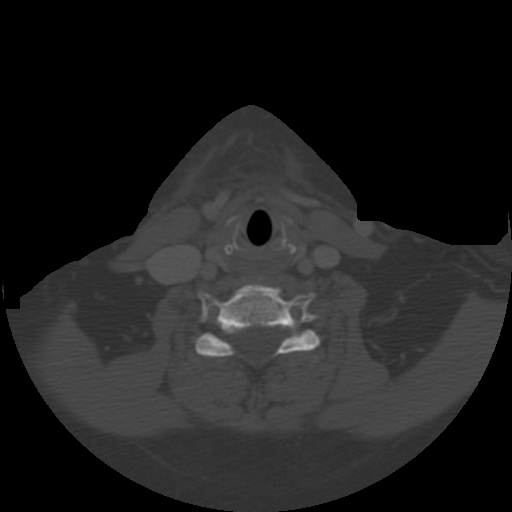
[im 71/95  bone]
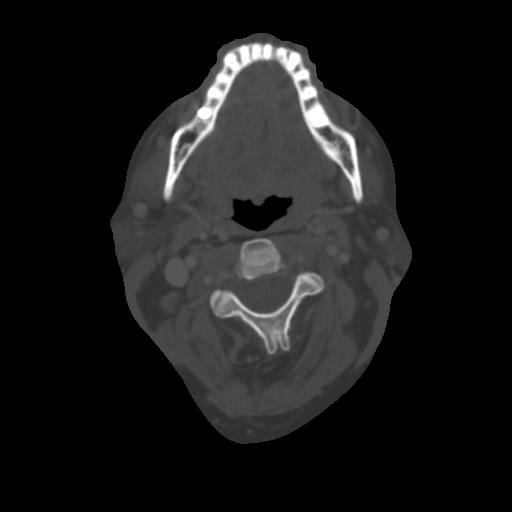

[Series 400: sagittal · sagittal · 0.47mm/px · 5 of 79 slices shown, 6 images]
[im 27/79  bone]
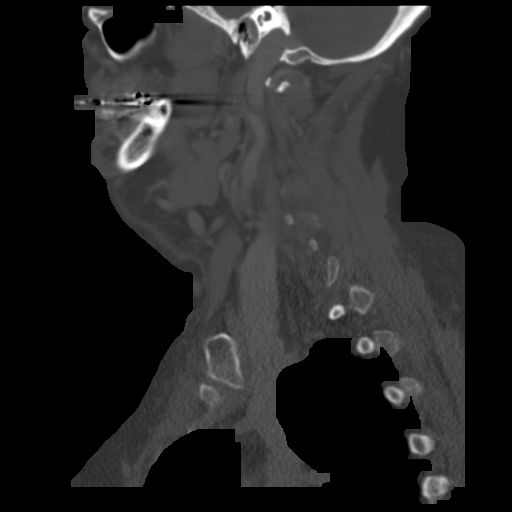
[im 33/79  bone]
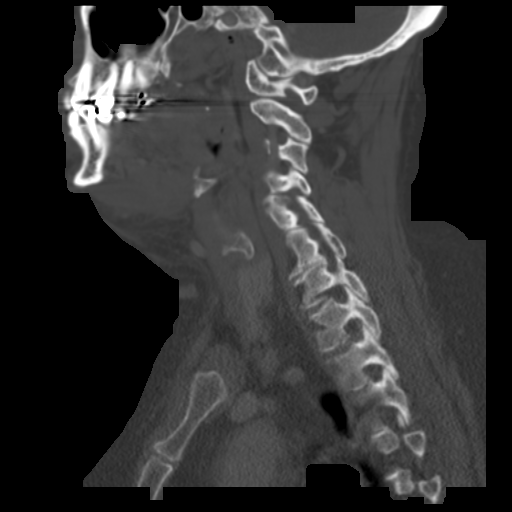
[im 40/79  soft-tissue]
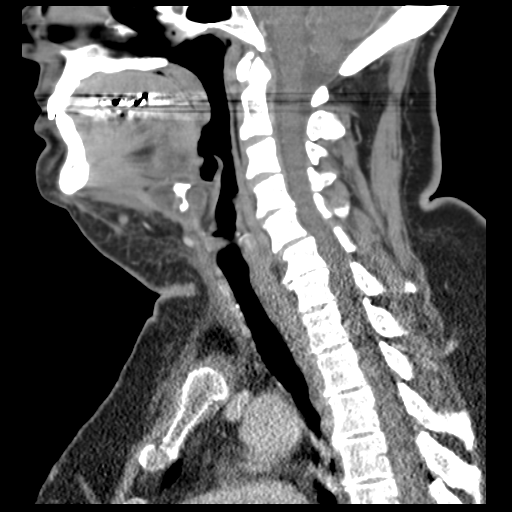
[im 40/79  bone]
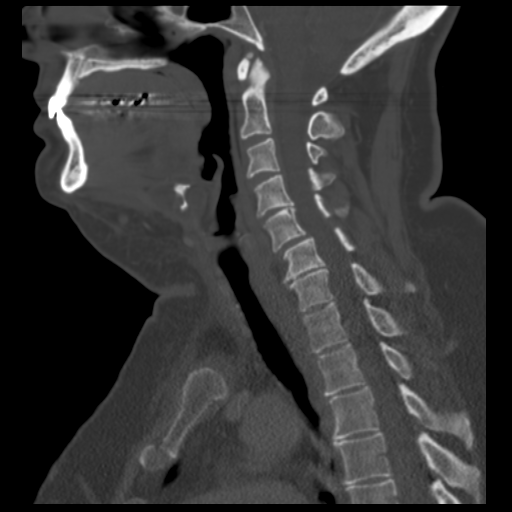
[im 46/79  bone]
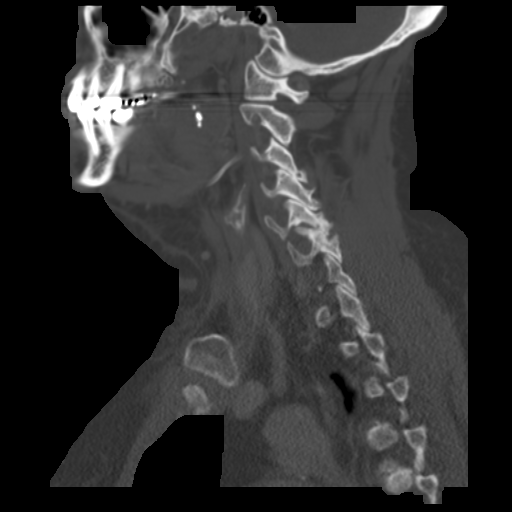
[im 53/79  bone]
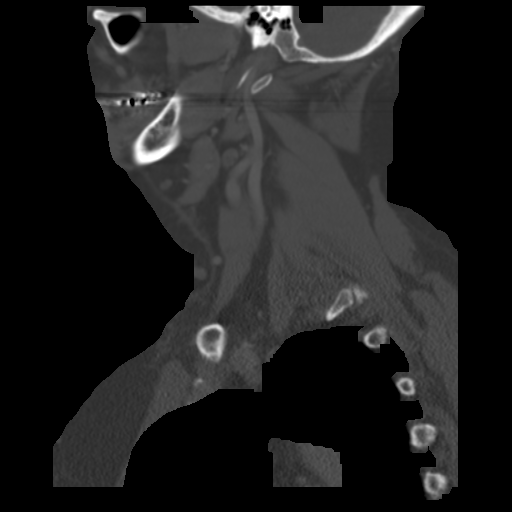

[Series 401: coronal · coronal · 0.47mm/px · 3 of 87 slices shown]
[im 18/87  bone]
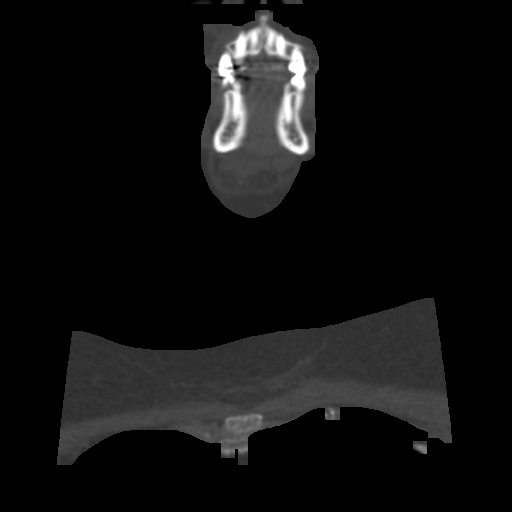
[im 35/87  bone]
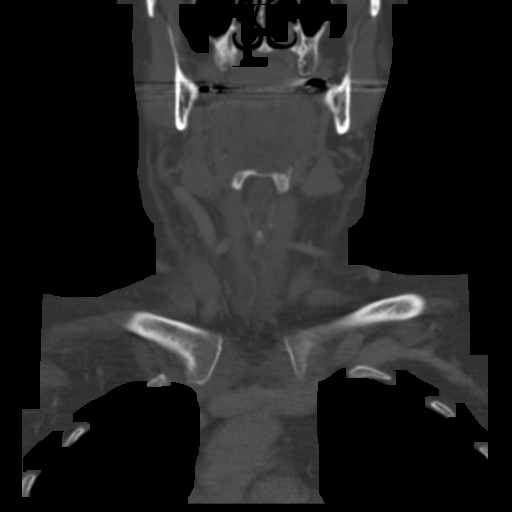
[im 52/87  bone]
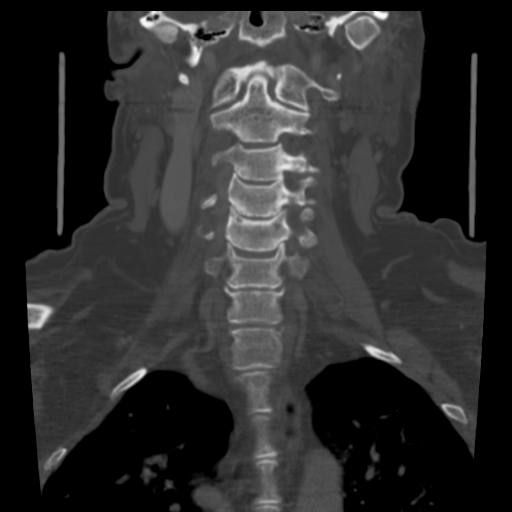

[Series 402: angled axial · axial · 0.47mm/px · z∈[-38,+71]mm · 3 of 95 slices shown, 4 images]
[im 24/95  soft-tissue]
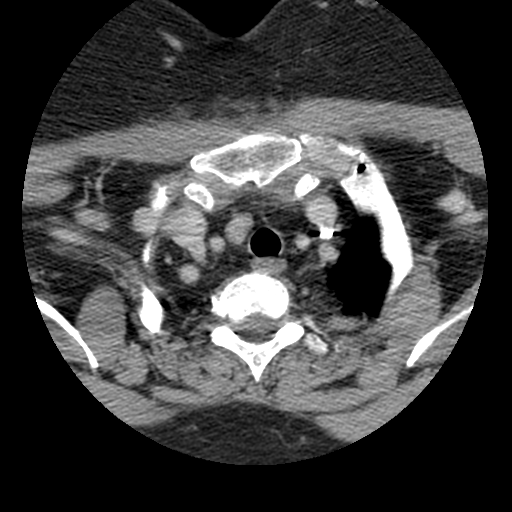
[im 24/95  bone]
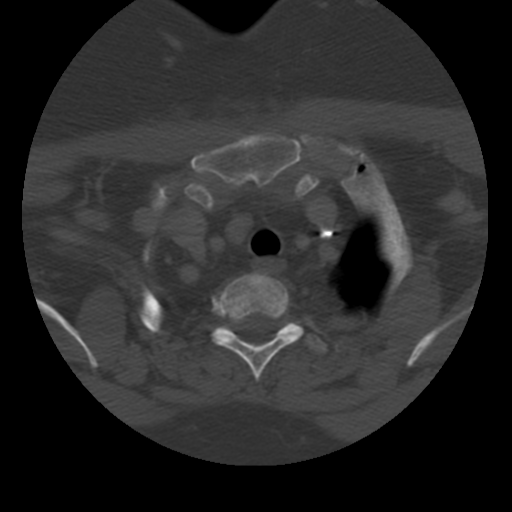
[im 48/95  bone]
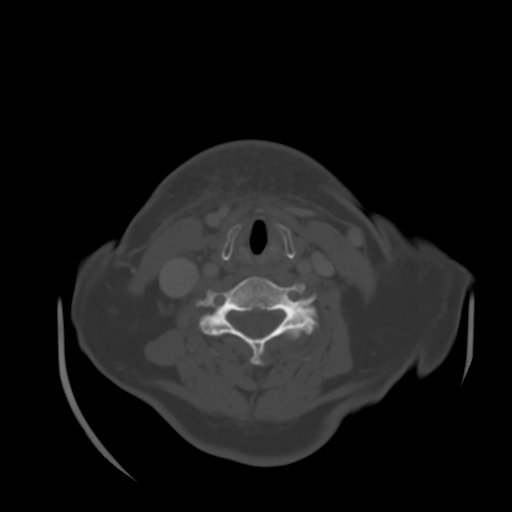
[im 71/95  bone]
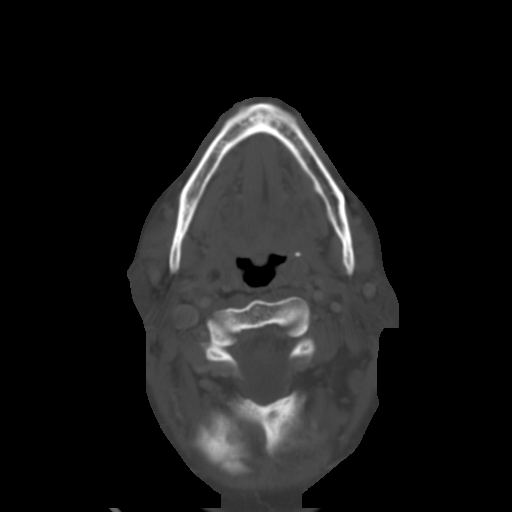

[14 of 33 positions shown; findings below may reference images not displayed]

FINDINGS: The submandibular ducts are slightly dilated bilaterally.
No discrete mass lesion is present within either the gland.  There
are no radiopaque obstructing stones.  A cystic lesion is noted
along the ventral aspect of the high elevated, measuring 12 mm.
There is a component of this which extends below evaluate.

There is asymmetric edematous change in slight enlargement of the
mylohyoid musculature on the right.  No discrete mass lesion is
evident.  Reactive sized nodes are present.

Subcutaneous edematous changes are present in the submental space
extending to the level of the thyroid cartilage.

Reactive sized level II lymph nodes are present bilaterally.  No
other significant adenopathy is present.  The upper mediastinum is
within normal limits.  The lung apices are clear. A pedunculated
aid right lower lobe thyroid nodule extends inferiorly, measuring
2.4 x 1.9 x 1.0 cm.

Bone windows demonstrate endplate degenerative changes and
uncovertebral spurring at to C4-5 greater than C2-3.  Degenerative
anterolisthesis at C5-6 measures 4 mm.  Chronic end plate
degenerative changes are noted at C6-7.
IMPRESSION: 1.
1.  Asymmetric edematous changes of the right greater than left
mylohyoid musculature.  This likely represents infection.  No
discrete mass is identified.  Neoplasm is considered significantly
less likely.
2.  Slight dilation of the sub mandibular ducts bilaterally without
an obstructing mass or stone.
3.  Cystic lesion at the level of the hyoid likely represents a
thyroglossal duct cyst.  There is no evidence for tumor or
infection.
4. Pedunculated right lower lobe thyroid nodule. Consider further
evaluation with thyroid ultrasound.  If patient is clinically
hyperthyroid, consider nuclear medicine thyroid uptake and scan.
5.  Moderate spondylosis of the cervical spine.

## 2016-11-25 ENCOUNTER — Ambulatory Visit: Payer: Medicare Other | Admitting: Allergy and Immunology

## 2016-11-25 ENCOUNTER — Encounter: Payer: Self-pay | Admitting: Allergy and Immunology

## 2016-11-25 DIAGNOSIS — R21 Rash and other nonspecific skin eruption: Secondary | ICD-10-CM | POA: Diagnosis not present

## 2016-11-25 DIAGNOSIS — Z881 Allergy status to other antibiotic agents status: Secondary | ICD-10-CM | POA: Diagnosis not present

## 2016-11-25 NOTE — Progress Notes (Signed)
New Patient Note  RE: Jennifer Eaton MRN: [id number] DOB: Jan 25, 1950 Date of Office Visit: 11/25/2016  Referring provider: Lawerance Cruel, MD Primary care provider: Lawerance Cruel, MD  Chief Complaint: Rash and Other (history of drug allergy)   History of present illness: Jennifer Eaton is a 66 y.o. female seen today in consultation requested by Lawerance Cruel, MD.  She is concerned because in December 2011 she developed a rash on her chest and as a result of this rash multiple antibiotics have been put on her allergy list.  On November 06, 2009 she had a lumpectomy at [facility].  She states that on November 29, 2009 she was treated for cellulitis with Keflex without symptoms.  On December 07, 2009 she was given clindamycin, again without symptoms.  On December 18, 2009 she received IV vancomycin without apparent side effects.  On December 28, 2009 she underwent a lumpectomy reexcision at [facility]. On January 03, 2010 she developed a rash on her chest shortly after putting on a surgical bra for the first time.  The rash persisted for over 2 weeks.  During a routine annual gynecology appointment on January 19, 2010 she was given Septra.  She reports that Keflex, clindamycin, vancomycin, and Septra were all put on her drug allergy list because they were not certain what had caused the rash on her chest.  On July 27, 2015 she was prescribed amoxicillin and completed the course without adverse symptoms.  On January 18, 2016 she is once again prescribed amoxicillin and developed a nonpruritic "light rash" which she believes was not an allergy but rather amoxicillin rash.  August 2014 she was prescribed clindamycin and did not experience adverse symptoms throughout the course of this antibiotic, therefore it was taken off of her allergy list.  She is convinced that the rash was from the surgical bra.  She states that she is able to wear latex gloves without symptoms.  She is  interested in testing to assess her allergic status.   Assessment and plan: History of allergy to antibiotic agent The patient's history suggests low probability of drug allergy to cephalexin, clindamycin, vancomycin, and sulfamethoxazole. Other than penicillin, skin testing to these medicatios would be of limited value due to poor sensitivity and specificity. Given the uncertainty, should she require one of above listed antibiotics, it would not be unreasonable to provide the the first dose in a graded fashion while under observation.  The patient is scheduled to return in the near future for Pre-Pen and PenG allergy skin testing and, if negative, open challenge.  Further recommendations will be made at that time based upon those test results.   Physical examination: Blood pressure 112/62, pulse 88, temperature 98.6 F (37 C), temperature source Oral, resp. rate 16, height _0  (1.626 m), weight 182 lb (82.6 kg), SpO2 95 %.  General: Alert, interactive, in no acute distress. Neck: Supple without lymphadenopathy. Lungs: Clear to auscultation without wheezing, rhonchi or rales. CV: Normal S1, S2 without murmurs. Abdomen: Nondistended, nontender. Skin: Warm and dry, without lesions or rashes. Extremities:  No clubbing, cyanosis or edema. Neuro:   Grossly intact.  Review of systems:  Review of systems negative except as noted in HPI / PMHx or noted below: Review of Systems  Constitutional: Negative.   HENT: Negative.   Eyes: Negative.   Respiratory: Negative.   Cardiovascular: Negative.   Gastrointestinal: Negative.   Genitourinary: Negative.   Musculoskeletal: Negative.   Skin: Negative.  Neurological: Negative.   Endo/Heme/Allergies: Negative.   Psychiatric/Behavioral: Negative.     Past medical history:  Past Medical History:  Diagnosis Date  . ADD (attention deficit disorder) without hyperactivity   . 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 Anxiety   . Arthritis   . Breast cancer of lower-outer quadrant of right female breast [facility])    born after mother was treated with DHES, shortened cervix, LCIS ,   . Cancer ([place])    hx lt breast cancer  . Hyperlipemia   . Right-sided hemiplegic cerebral palsy ([place])   . Seasonal allergies   . Shortness of breath     Past surgical history:  Past Surgical History:  Procedure Laterality Date  . ABDOMINAL HYSTERECTOMY  2000   hyst-BSO  . BREAST SURGERY  10/11   lt lumpectomy-snbx  . BREAST SURGERY  12/11   lt breast re-excision  . COLONOSCOPY    . GREAT TOE ARTHRODESIS, INTERPHALANGEAL JOINT     lt great toe-plate  . MOUTH SURGERY  2013  . thyroidglossal cyst Left 09-10-12    [facility]    Family history: Family History  Problem Relation Age of Onset  . Coronary artery disease Father   . Multiple myeloma Mother   . ADD / ADHD Son   . Seizures Brother   . Stroke Brother     Social history: Social History   Socioeconomic History  . Marital status: Married    Spouse name: [name]  . Number of children: 2  . Years of education: College  . Highest education level: Not on file  Social Needs  . Financial resource strain: Not on file  . Food insecurity - worry: Not on file  . Food insecurity - inability: Not on file  . Transportation needs - medical: Not on file  . Transportation needs - non-medical: Not on file  Occupational History  . Occupation: TEACHER    Employer: [employer]: retired  Tobacco Use  . Smoking status: Never Smoker  . Smokeless tobacco: Never Used  Substance and Sexual Activity  . Alcohol use: No  . Drug use: No  . Sexual activity: Not on file  Other Topics Concern  . Not on file  Social History Narrative   Patient is married [name]) and lives at home with her husband.   Patient has two children.   Patient is a Pharmacist, hospital.   Patient has a college education.   Patient is left-handed.   Patient drinks one  cup of coffee every morning.   Environmental History: The patient lives in a 66 year old house with carpeting throughout and central air/heat.  There is no mold/water damage in the home.  She is a non-smoker without pets.   Allergies as of 11/25/2016      Reactions   Cephalexin Hives   Lipitor [atorvastatin]    Myalgia, myositis, memory changes.    Vancomycin Hives   Amoxicillin Hives, Rash   Latex Rash   Sulfa Antibiotics Rash      Medication List        Accurate as of 11/25/16  1:16 PM. Always use your most recent med list.          aspirin 81 MG tablet Take 81 mg by mouth daily.   Biotin 5 MG Caps Take 1 capsule by mouth daily.   CALCIUM CITRATE PO Take by mouth.   Cholecalciferol 2000 units Caps Take 2 capsules by  mouth daily.   co-enzyme Q-10 50 MG capsule Take 50 mg by mouth daily.   diazepam 5 MG tablet Commonly known as:  VALIUM Take 5 mg by mouth every 8 (eight) hours as needed for anxiety. As needed for medical procedures.   fish oil-omega-3 fatty acids 1000 MG capsule Take 1 g by mouth daily.   fluticasone 50 MCG/ACT nasal spray Commonly known as:  FLONASE Place 2 sprays into the nose daily as needed (cold).   ibuprofen 200 MG tablet Commonly known as:  ADVIL,MOTRIN Take 200 mg by mouth every 6 (six) hours as needed (pain).   letrozole 2.5 MG tablet Commonly known as:  FEMARA Take 2.5 mg by mouth daily.   MAGNESIUM CITRATE PO Take by mouth.   Multiple Vitamin Caps Take 1 capsule by mouth daily.   PROBIOTIC DAILY PO Take 1 tablet by mouth daily. Cutrell   rosuvastatin 5 MG tablet Commonly known as:  CRESTOR Take 5 mg by mouth daily.   SYNTHROID 100 MCG tablet Generic drug:  levothyroxine Take 1 tablet by mouth daily before breakfast.   tiZANidine 2 MG tablet Commonly known as:  ZANAFLEX TAKE 1 TO 2 TABLETS BY MOUTH 3 TIMES A DAY AS NEEDED FOR TIGHT MUSCLES   zolpidem 10 MG tablet Commonly known as:  AMBIEN Take 5 mg by mouth at  bedtime as needed for sleep.       Known medication allergies: Allergies  Allergen Reactions  . Cephalexin Hives  . Lipitor [Atorvastatin]     Myalgia, myositis, memory changes.   . Vancomycin Hives  . Amoxicillin Hives and Rash  . Latex Rash  . Sulfa Antibiotics Rash    I appreciate the opportunity to take part in Jennifer Eaton's care. Please do not hesitate to contact me with questions.  Sincerely,   R. Edgar Frisk, MD

## 2016-11-25 NOTE — Patient Instructions (Signed)
History of allergy to antibiotic agent The patient's history suggests low probability of drug allergy to cephalexin, clindamycin, vancomycin, and sulfamethoxazole. Other than penicillin, skin testing to these medicatios would be of limited value due to poor sensitivity and specificity. Given the uncertainty, should she require one of above listed antibiotics, it would not be unreasonable to provide the the first dose in a graded fashion while under observation.  The patient is scheduled to return in the near future for Pre-Pen and PenG allergy skin testing and, if negative, open challenge.  Further recommendations will be made at that time based upon those test results.   Return Pre-Pen and PenG testing.

## 2016-11-25 NOTE — Assessment & Plan Note (Addendum)
The patient's history suggests low probability of drug allergy to cephalexin, clindamycin, vancomycin, and sulfamethoxazole. Other than penicillin, skin testing to these medicatios would be of limited value due to poor sensitivity and specificity. Given the uncertainty, should she require one of above listed antibiotics, it would not be unreasonable to provide the the first dose in a graded fashion while under observation.  The patient is scheduled to return in the near future for Pre-Pen and PenG allergy skin testing and, if negative, open challenge.  Further recommendations will be made at that time based upon those test results.

## 2017-01-06 ENCOUNTER — Encounter: Payer: Medicare Other | Admitting: Allergy and Immunology

## 2017-01-07 ENCOUNTER — Encounter: Payer: Medicare Other | Admitting: Allergy and Immunology

## 2017-01-07 ENCOUNTER — Telehealth: Payer: Self-pay | Admitting: Allergy and Immunology

## 2017-01-07 NOTE — Telephone Encounter (Signed)
Patient has an amox challenge in January Patient had to cancel last time because medication wasn't at the office at the time of her appt Patient wants to be called prior to appt if medication is not in office in January for the appt

## 2017-01-07 NOTE — Telephone Encounter (Signed)
I spoke with patient and advised her to call back a week ahead of time to verify that we had the needed serum for the penicillin testing.

## 2017-01-28 ENCOUNTER — Encounter: Payer: Medicare Other | Admitting: Allergy and Immunology

## 2017-02-17 ENCOUNTER — Ambulatory Visit: Payer: Medicare Other | Admitting: Allergy and Immunology

## 2017-02-17 ENCOUNTER — Encounter: Payer: Self-pay | Admitting: Allergy and Immunology

## 2017-02-17 VITALS — BP 116/62 | HR 91 | Resp 18

## 2017-02-17 DIAGNOSIS — Z881 Allergy status to other antibiotic agents status: Secondary | ICD-10-CM

## 2017-02-17 NOTE — Progress Notes (Addendum)
Follow-up Note  RE: Jennifer Eaton MRN: [id number] DOB: 19-Oct-1950 Date of Office Visit: 02/17/2017  Primary care provider: Lawerance Cruel, MD Referring provider: Lawerance Cruel, MD  History of present illness: Jennifer Eaton is a 67 y.o. female with history of multiple antibiotic allergies here for Pre-Pen/pen G skin testing and, if negative, open graded challenge to penicillin.    Assessment and plan: History of allergy to antibiotic agent The patient has a low/moderate probability history for penicillin allergy and had negative skin tests to Pre-Pen and pen G.  She was able to tolerate penicillin V oral challenge today without adverse signs or symptoms. Vital signs were stable throughout the challenge and observation period. Therefore, she has the same risk of systemic reaction associated with penicillin as the general population.  She has been asked to contact me or seek medical attention if untoward symptoms arise over the next 24-48 hours.   Diagnostics: PenG skin testing: Negative percutaneous and intradermal tests at all dilutions despite a positive histamine control. PrePen skin testing: Negative percutaneous and intradermal tests at all dilutions despite a positive histamine control. Open penicillin challenge: The patient was able to tolerate the challenge today without adverse signs or symptoms. Vital signs were stable throughout the challenge and observation period.      Physical examination: Blood pressure 116/62, pulse 91, resp. rate 18, SpO2 95 %.  General: Alert, interactive, in no acute distress. Neck: Supple without lymphadenopathy. Lungs: Clear to auscultation without wheezing, rhonchi or rales. CV: Normal S1, S2 without murmurs. Skin: Warm and dry, without lesions or rashes.  The following portions of the patient's history were reviewed and updated as appropriate: allergies, current medications, past family history, past medical history, past  social history, past surgical history and problem list.  Allergies as of 02/17/2017      Reactions   Cephalexin Hives   Lipitor [atorvastatin]    Myalgia, myositis, memory changes.    Vancomycin Hives   Amoxicillin Hives, Rash   Latex Rash   Sulfa Antibiotics Rash      Medication List        Accurate as of 02/17/17  4:04 PM. Always use your most recent med list.          aspirin 81 MG tablet Take 81 mg by mouth daily.   Biotin 5 MG Caps Take 1 capsule by mouth daily.   Cholecalciferol 2000 units Caps Take 2 capsules by mouth daily.   co-enzyme Q-10 50 MG capsule Take 50 mg by mouth daily.   diazepam 5 MG tablet Commonly known as:  VALIUM Take 5 mg by mouth every 8 (eight) hours as needed for anxiety. As needed for medical procedures.   fish oil-omega-3 fatty acids 1000 MG capsule Take 1 g by mouth daily.   fluticasone 50 MCG/ACT nasal spray Commonly known as:  FLONASE Place 2 sprays into the nose daily as needed (cold).   ibuprofen 200 MG tablet Commonly known as:  ADVIL,MOTRIN Take 200 mg by mouth every 6 (six) hours as needed (pain).   letrozole 2.5 MG tablet Commonly known as:  FEMARA Take 2.5 mg by mouth daily.   Multiple Vitamin Caps Take 1 capsule by mouth daily.   PROBIOTIC DAILY PO Take 1 tablet by mouth daily. Cutrell   rosuvastatin 5 MG tablet Commonly known as:  CRESTOR Take 5 mg by mouth daily.   SYNTHROID 88 MCG tablet Generic drug:  levothyroxine Take 88 mcg by mouth daily.  tiZANidine 2 MG tablet Commonly known as:  ZANAFLEX TAKE 1 TO 2 TABLETS BY MOUTH 3 TIMES A DAY AS NEEDED FOR TIGHT MUSCLES   zolpidem 10 MG tablet Commonly known as:  AMBIEN Take 5 mg by mouth at bedtime as needed for sleep.       Allergies  Allergen Reactions  . Cephalexin Hives  . Lipitor [Atorvastatin]     Myalgia, myositis, memory changes.   . Vancomycin Hives  . Amoxicillin Hives and Rash  . Latex Rash  . Sulfa Antibiotics Rash   Review of  systems: Review of systems negative except as noted in HPI / PMHx or noted below: Constitutional: Negative.  HENT: Negative.   Eyes: Negative.  Respiratory: Negative.   Cardiovascular: Negative.  Gastrointestinal: Negative.  Genitourinary: Negative.  Musculoskeletal: Negative.  Neurological: Negative.  Endo/Heme/Allergies: Negative.  Cutaneous: Negative.  Past Medical History:  Diagnosis Date  . ADD (attention deficit disorder) without hyperactivity   . 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 Anxiety   . Arthritis   . Breast cancer of lower-outer quadrant of right female breast [facility])    born after mother was treated with DHES, shortened cervix, LCIS ,   . Cancer ([place])    hx lt breast cancer  . Hyperlipemia   . Right-sided hemiplegic cerebral palsy ([place])   . Seasonal allergies   . Shortness of breath     Family History  Problem Relation Age of Onset  . Coronary artery disease Father   . Multiple myeloma Mother   . ADD / ADHD Son   . Seizures Brother   . Stroke Brother     Social History   Socioeconomic History  . Marital status: Married    Spouse name: Jennifer Eaton  . Number of children: 2  . Years of education: College  . Highest education level: Not on file  Social Needs  . Financial resource strain: Not on file  . Food insecurity - worry: Not on file  . Food insecurity - inability: Not on file  . Transportation needs - medical: Not on file  . Transportation needs - non-medical: Not on file  Occupational History  . Occupation: TEACHER    Employer: [employer]: retired  Tobacco Use  . Smoking status: Never Smoker  . Smokeless tobacco: Never Used  Substance and Sexual Activity  . Alcohol use: No  . Drug use: No  . Sexual activity: Not on file  Other Topics Concern  . Not on file  Social History Narrative   Patient is married Jennifer Eaton) and lives at home with her husband.   Patient has two children.    Patient is a Pharmacist, hospital.   Patient has a college education.   Patient is left-handed.   Patient drinks one cup of coffee every morning.    I appreciate the opportunity to take part in Jennifer Eaton's care. Please do not hesitate to contact me with questions.  Sincerely,   R. Edgar Frisk, MD  I attest that the patient was in the office for over 3 hours for the penG/PrePen skin testing and penicillin V oral challenge.

## 2017-02-17 NOTE — Assessment & Plan Note (Signed)
The patient has a low/moderate probability history for penicillin allergy and had negative skin tests to Pre-Pen and pen G.  She was able to tolerate penicillin V oral challenge today without adverse signs or symptoms. Vital signs were stable throughout the challenge and observation period. Therefore, she has the same risk of systemic reaction associated with penicillin as the general population.  She has been asked to contact me or seek medical attention if untoward symptoms arise over the next 24-48 hours.

## 2017-02-17 NOTE — Patient Instructions (Addendum)
History of allergy to antibiotic agent The patient has a low/moderate probability history for penicillin allergy and had negative skin tests to Pre-Pen and pen G.  She was able to tolerate penicillin V oral challenge today without adverse signs or symptoms. Vital signs were stable throughout the challenge and observation period. Therefore, she has the same risk of systemic reaction associated with penicillin as the general population.  She has been asked to contact me or seek medical attention if untoward symptoms arise over the next 24-48 hours.

## 2020-01-13 DIAGNOSIS — Z20822 Contact with and (suspected) exposure to covid-19: Secondary | ICD-10-CM | POA: Diagnosis not present

## 2020-02-23 DIAGNOSIS — Z170 Estrogen receptor positive status [ER+]: Secondary | ICD-10-CM | POA: Diagnosis not present

## 2020-02-23 DIAGNOSIS — Z23 Encounter for immunization: Secondary | ICD-10-CM | POA: Diagnosis not present

## 2020-02-23 DIAGNOSIS — Z803 Family history of malignant neoplasm of breast: Secondary | ICD-10-CM | POA: Diagnosis not present

## 2020-02-23 DIAGNOSIS — Z9189 Other specified personal risk factors, not elsewhere classified: Secondary | ICD-10-CM | POA: Diagnosis not present

## 2020-02-23 DIAGNOSIS — Z8041 Family history of malignant neoplasm of ovary: Secondary | ICD-10-CM | POA: Diagnosis not present

## 2020-02-23 DIAGNOSIS — Z86010 Personal history of colonic polyps: Secondary | ICD-10-CM | POA: Diagnosis not present

## 2020-02-23 DIAGNOSIS — C50911 Malignant neoplasm of unspecified site of right female breast: Secondary | ICD-10-CM | POA: Diagnosis not present

## 2020-02-23 DIAGNOSIS — Z853 Personal history of malignant neoplasm of breast: Secondary | ICD-10-CM | POA: Diagnosis not present

## 2020-03-23 DIAGNOSIS — Z9889 Other specified postprocedural states: Secondary | ICD-10-CM | POA: Diagnosis not present

## 2020-03-23 DIAGNOSIS — Z170 Estrogen receptor positive status [ER+]: Secondary | ICD-10-CM | POA: Diagnosis not present

## 2020-03-23 DIAGNOSIS — Z9189 Other specified personal risk factors, not elsewhere classified: Secondary | ICD-10-CM | POA: Diagnosis not present

## 2020-03-23 DIAGNOSIS — Z853 Personal history of malignant neoplasm of breast: Secondary | ICD-10-CM | POA: Diagnosis not present

## 2020-03-23 DIAGNOSIS — C50911 Malignant neoplasm of unspecified site of right female breast: Secondary | ICD-10-CM | POA: Diagnosis not present

## 2020-03-30 DIAGNOSIS — Z79811 Long term (current) use of aromatase inhibitors: Secondary | ICD-10-CM | POA: Diagnosis not present

## 2020-03-30 DIAGNOSIS — M47814 Spondylosis without myelopathy or radiculopathy, thoracic region: Secondary | ICD-10-CM | POA: Diagnosis not present

## 2020-03-30 DIAGNOSIS — M549 Dorsalgia, unspecified: Secondary | ICD-10-CM | POA: Diagnosis not present

## 2020-03-30 DIAGNOSIS — C50911 Malignant neoplasm of unspecified site of right female breast: Secondary | ICD-10-CM | POA: Diagnosis not present

## 2020-03-30 DIAGNOSIS — Z1379 Encounter for other screening for genetic and chromosomal anomalies: Secondary | ICD-10-CM | POA: Diagnosis not present

## 2020-03-30 DIAGNOSIS — Z923 Personal history of irradiation: Secondary | ICD-10-CM | POA: Diagnosis not present

## 2020-03-30 DIAGNOSIS — M818 Other osteoporosis without current pathological fracture: Secondary | ICD-10-CM | POA: Diagnosis not present

## 2020-03-30 DIAGNOSIS — Z170 Estrogen receptor positive status [ER+]: Secondary | ICD-10-CM | POA: Diagnosis not present

## 2020-03-30 DIAGNOSIS — D0502 Lobular carcinoma in situ of left breast: Secondary | ICD-10-CM | POA: Diagnosis not present

## 2020-04-06 ENCOUNTER — Telehealth: Payer: Medicare PPO | Admitting: Dermatology

## 2020-04-06 NOTE — Telephone Encounter (Signed)
Patient calling about Dx from 11/2017 (hidrocystoma) she asked for [facility] phone number. I gave patient contact info.

## 2020-05-09 DIAGNOSIS — Z20822 Contact with and (suspected) exposure to covid-19: Secondary | ICD-10-CM | POA: Diagnosis not present

## 2020-05-10 DIAGNOSIS — U071 COVID-19: Secondary | ICD-10-CM | POA: Diagnosis not present

## 2020-05-17 DIAGNOSIS — Z20822 Contact with and (suspected) exposure to covid-19: Secondary | ICD-10-CM | POA: Diagnosis not present

## 2020-05-30 DIAGNOSIS — Z20822 Contact with and (suspected) exposure to covid-19: Secondary | ICD-10-CM | POA: Diagnosis not present

## 2020-06-12 DIAGNOSIS — D23111 Other benign neoplasm of skin of right upper eyelid, including canthus: Secondary | ICD-10-CM | POA: Diagnosis not present

## 2020-06-12 DIAGNOSIS — D2310 Other benign neoplasm of skin of unspecified eyelid, including canthus: Secondary | ICD-10-CM | POA: Diagnosis not present

## 2020-07-20 DIAGNOSIS — D2310 Other benign neoplasm of skin of unspecified eyelid, including canthus: Secondary | ICD-10-CM | POA: Diagnosis not present

## 2020-08-29 DIAGNOSIS — H179 Unspecified corneal scar and opacity: Secondary | ICD-10-CM | POA: Diagnosis not present

## 2020-08-29 DIAGNOSIS — H0102A Squamous blepharitis right eye, upper and lower eyelids: Secondary | ICD-10-CM | POA: Diagnosis not present

## 2020-08-29 DIAGNOSIS — H0102B Squamous blepharitis left eye, upper and lower eyelids: Secondary | ICD-10-CM | POA: Diagnosis not present

## 2020-08-29 DIAGNOSIS — H2513 Age-related nuclear cataract, bilateral: Secondary | ICD-10-CM | POA: Diagnosis not present

## 2020-08-29 DIAGNOSIS — H04123 Dry eye syndrome of bilateral lacrimal glands: Secondary | ICD-10-CM | POA: Diagnosis not present

## 2020-08-29 DIAGNOSIS — H10413 Chronic giant papillary conjunctivitis, bilateral: Secondary | ICD-10-CM | POA: Diagnosis not present

## 2020-09-22 DIAGNOSIS — E039 Hypothyroidism, unspecified: Secondary | ICD-10-CM | POA: Diagnosis not present

## 2020-09-22 DIAGNOSIS — E559 Vitamin D deficiency, unspecified: Secondary | ICD-10-CM | POA: Diagnosis not present

## 2020-09-22 DIAGNOSIS — Z79899 Other long term (current) drug therapy: Secondary | ICD-10-CM | POA: Diagnosis not present

## 2020-09-22 DIAGNOSIS — E782 Mixed hyperlipidemia: Secondary | ICD-10-CM | POA: Diagnosis not present

## 2020-10-05 DIAGNOSIS — Z9189 Other specified personal risk factors, not elsewhere classified: Secondary | ICD-10-CM | POA: Diagnosis not present

## 2020-10-05 DIAGNOSIS — N6489 Other specified disorders of breast: Secondary | ICD-10-CM | POA: Diagnosis not present

## 2020-10-05 DIAGNOSIS — Z1379 Encounter for other screening for genetic and chromosomal anomalies: Secondary | ICD-10-CM | POA: Diagnosis not present

## 2020-10-05 DIAGNOSIS — D0502 Lobular carcinoma in situ of left breast: Secondary | ICD-10-CM | POA: Diagnosis not present

## 2020-10-05 DIAGNOSIS — Z79811 Long term (current) use of aromatase inhibitors: Secondary | ICD-10-CM | POA: Diagnosis not present

## 2020-10-05 DIAGNOSIS — Z853 Personal history of malignant neoplasm of breast: Secondary | ICD-10-CM | POA: Diagnosis not present

## 2020-10-05 DIAGNOSIS — C50911 Malignant neoplasm of unspecified site of right female breast: Secondary | ICD-10-CM | POA: Diagnosis not present

## 2020-10-05 DIAGNOSIS — Z923 Personal history of irradiation: Secondary | ICD-10-CM | POA: Diagnosis not present

## 2020-10-05 DIAGNOSIS — Z170 Estrogen receptor positive status [ER+]: Secondary | ICD-10-CM | POA: Diagnosis not present

## 2020-10-24 DIAGNOSIS — R000 Tachycardia, unspecified: Secondary | ICD-10-CM | POA: Diagnosis not present

## 2020-10-31 ENCOUNTER — Encounter: Payer: Self-pay | Admitting: Dermatology

## 2020-10-31 ENCOUNTER — Ambulatory Visit: Payer: Medicare PPO | Admitting: Dermatology

## 2020-10-31 ENCOUNTER — Encounter (INDEPENDENT_AMBULATORY_CARE_PROVIDER_SITE_OTHER): Payer: Self-pay

## 2020-10-31 ENCOUNTER — Other Ambulatory Visit: Payer: Self-pay

## 2020-10-31 DIAGNOSIS — L821 Other seborrheic keratosis: Secondary | ICD-10-CM | POA: Diagnosis not present

## 2020-10-31 DIAGNOSIS — Z1283 Encounter for screening for malignant neoplasm of skin: Secondary | ICD-10-CM | POA: Diagnosis not present

## 2020-10-31 DIAGNOSIS — L570 Actinic keratosis: Secondary | ICD-10-CM

## 2020-10-31 DIAGNOSIS — D1801 Hemangioma of skin and subcutaneous tissue: Secondary | ICD-10-CM

## 2020-10-31 DIAGNOSIS — Z85828 Personal history of other malignant neoplasm of skin: Secondary | ICD-10-CM | POA: Diagnosis not present

## 2020-10-31 DIAGNOSIS — D485 Neoplasm of uncertain behavior of skin: Secondary | ICD-10-CM

## 2020-10-31 NOTE — Patient Instructions (Signed)
Biopsy, Surgery (Curettage) & Surgery (Excision) Aftercare Instructions  1. Okay to remove bandage in 24 hours  2. Wash area with soap and water  3. Apply Vaseline to area twice daily until healed (Not Neosporin)  4. Okay to cover with a Band-Aid to decrease the chance of infection or prevent irritation from clothing; also it's okay to uncover lesion at home.  5. Suture instructions: return to our office in 7-10 or 10-14 days for a nurse visit for suture removal. Variable healing with sutures, if pain or itching occurs call our office. It's okay to shower or bathe 24 hours after sutures are given.  6. The following risks may occur after a biopsy, curettage or excision: bleeding, scarring, discoloration, recurrence, infection (redness, yellow drainage, pain or swelling).  7. For questions, concerns and results call our office at [place] before 4pm & Friday before 3pm. Biopsy results will be available in 1 week. Biopsy, Surgery (Curettage) & Surgery (Excision) Aftercare Instructions  1. Okay to remove bandage in 24 hours  2. Wash area with soap and water  3. Apply Vaseline to area twice daily until healed (Not Neosporin)  4. Okay to cover with a Band-Aid to decrease the chance of infection or prevent irritation from clothing; also it's okay to uncover lesion at home.  5. Suture instructions: return to our office in 7-10 or 10-14 days for a nurse visit for suture removal. Variable healing with sutures, if pain or itching occurs call our office. It's okay to shower or bathe 24 hours after sutures are given.  6. The following risks may occur after a biopsy, curettage or excision: bleeding, scarring, discoloration, recurrence, infection (redness, yellow drainage, pain or swelling).  7. For questions, concerns and results call our office at [place] before 4pm & Friday before 3pm. Biopsy results will be available in 1 week.

## 2020-11-14 ENCOUNTER — Telehealth: Payer: Self-pay | Admitting: Dermatology

## 2020-11-14 NOTE — Telephone Encounter (Signed)
Patient is calling for the pathology report from last visit with Lavonna Monarch, M.D.

## 2020-11-14 NOTE — Telephone Encounter (Signed)
Phone call to patient with her pathology results. Patient aware.  

## 2020-11-15 ENCOUNTER — Encounter: Payer: Self-pay | Admitting: Dermatology

## 2020-11-15 NOTE — Progress Notes (Signed)
   Follow-Up Visit   Subjective  Jennifer Eaton is a 70 y.o. female who presents for the following: Annual Exam (Patient here today for yearly skin check no concerns. Personal history of non mole skin cancer. No family history of atypical moles, melanoma or non mole skin cancer. ).  General skin examination Location:  Duration:  Quality:  Associated Signs/Symptoms: Modifying Factors:  Severity:  Timing: Context:   Objective  Well appearing patient in no apparent distress; mood and affect are within normal limits. Multiple 1 to 2 mm smooth red dermal papules  Mid Back 7 mm waxy pink crust, possible superficial carcinoma.       Right Lower Back 5 mm brown textured flattopped papule  Mid Back Full body skin exam.  No atypical pigmented lesions.  1 possible nonmelanoma skin cancer upper back will be biopsied and treated.    A full examination was performed including scalp, head, eyes, ears, nose, lips, neck, chest, axillae, abdomen, back, buttocks, bilateral upper extremities, bilateral lower extremities, hands, feet, fingers, toes, fingernails, and toenails. All findings within normal limits unless otherwise noted below.  Areas beneath undergarments not fully examined.   Assessment & Plan    Neoplasm of uncertain behavior of skin Mid Back  Skin / nail biopsy Type of biopsy: tangential   Informed consent: discussed and consent obtained   Timeout: patient name, date of birth, surgical site, and procedure verified   Anesthesia: the lesion was anesthetized in a standard fashion   Anesthetic:  1% lidocaine w/ epinephrine 1-100,000 local infiltration Instrument used: flexible razor blade   Hemostasis achieved with: ferric subsulfate   Outcome: patient tolerated procedure well   Post-procedure details: wound care instructions given    Destruction of lesion Complexity: simple   Destruction method: electrodesiccation and curettage   Informed consent: discussed and  consent obtained   Timeout:  patient name, date of birth, surgical site, and procedure verified Anesthesia: the lesion was anesthetized in a standard fashion   Anesthetic:  1% lidocaine w/ epinephrine 1-100,000 local infiltration Curettage performed in three different directions: Yes   Curettage cycles:  3 Lesion width (cm):  0.7 Margin per side (cm):  0 Final wound size (cm):  0.7 Hemostasis achieved with:  aluminum chloride Outcome: patient tolerated procedure well with no complications   Post-procedure details: wound care instructions given    Specimen 1 - Surgical pathology Differential Diagnosis: BCC txpbx Check Margins: no  After shave biopsy the base was treated with curettage plus cautery  Hemangioma of skin  No intervention necessary  Seborrheic keratosis Right Lower Back  Leave if stable  Screening for malignant neoplasm of skin Mid Back  Yearly skin exam.      I, Lavonna Monarch, MD, have reviewed all documentation for this visit.  The documentation on 11/15/20 for the exam, diagnosis, procedures, and orders are all accurate and complete.

## 2020-11-23 DIAGNOSIS — Z23 Encounter for immunization: Secondary | ICD-10-CM | POA: Diagnosis not present

## 2020-11-23 DIAGNOSIS — E039 Hypothyroidism, unspecified: Secondary | ICD-10-CM | POA: Diagnosis not present

## 2020-11-23 DIAGNOSIS — F419 Anxiety disorder, unspecified: Secondary | ICD-10-CM | POA: Diagnosis not present

## 2020-11-23 DIAGNOSIS — E559 Vitamin D deficiency, unspecified: Secondary | ICD-10-CM | POA: Diagnosis not present

## 2020-11-23 DIAGNOSIS — G809 Cerebral palsy, unspecified: Secondary | ICD-10-CM | POA: Diagnosis not present

## 2020-11-23 DIAGNOSIS — E782 Mixed hyperlipidemia: Secondary | ICD-10-CM | POA: Diagnosis not present

## 2020-11-23 DIAGNOSIS — Z79899 Other long term (current) drug therapy: Secondary | ICD-10-CM | POA: Diagnosis not present

## 2020-11-23 DIAGNOSIS — G4700 Insomnia, unspecified: Secondary | ICD-10-CM | POA: Diagnosis not present

## 2020-11-23 DIAGNOSIS — Z0000 Encounter for general adult medical examination without abnormal findings: Secondary | ICD-10-CM | POA: Diagnosis not present

## 2020-11-29 DIAGNOSIS — H539 Unspecified visual disturbance: Secondary | ICD-10-CM | POA: Diagnosis not present

## 2020-11-29 DIAGNOSIS — E782 Mixed hyperlipidemia: Secondary | ICD-10-CM | POA: Diagnosis not present

## 2020-11-29 DIAGNOSIS — R0989 Other specified symptoms and signs involving the circulatory and respiratory systems: Secondary | ICD-10-CM | POA: Diagnosis not present

## 2020-12-13 DIAGNOSIS — R1310 Dysphagia, unspecified: Secondary | ICD-10-CM | POA: Diagnosis not present

## 2020-12-13 DIAGNOSIS — Z86010 Personal history of colonic polyps: Secondary | ICD-10-CM | POA: Diagnosis not present

## 2020-12-13 DIAGNOSIS — K219 Gastro-esophageal reflux disease without esophagitis: Secondary | ICD-10-CM | POA: Diagnosis not present

## 2021-02-13 DIAGNOSIS — D123 Benign neoplasm of transverse colon: Secondary | ICD-10-CM | POA: Diagnosis not present

## 2021-02-13 DIAGNOSIS — K649 Unspecified hemorrhoids: Secondary | ICD-10-CM | POA: Diagnosis not present

## 2021-02-13 DIAGNOSIS — K449 Diaphragmatic hernia without obstruction or gangrene: Secondary | ICD-10-CM | POA: Diagnosis not present

## 2021-02-13 DIAGNOSIS — D122 Benign neoplasm of ascending colon: Secondary | ICD-10-CM | POA: Diagnosis not present

## 2021-02-13 DIAGNOSIS — Z86010 Personal history of colonic polyps: Secondary | ICD-10-CM | POA: Diagnosis not present

## 2021-02-13 DIAGNOSIS — K5730 Diverticulosis of large intestine without perforation or abscess without bleeding: Secondary | ICD-10-CM | POA: Diagnosis not present

## 2021-02-13 DIAGNOSIS — R1310 Dysphagia, unspecified: Secondary | ICD-10-CM | POA: Diagnosis not present

## 2021-02-16 DIAGNOSIS — D122 Benign neoplasm of ascending colon: Secondary | ICD-10-CM | POA: Diagnosis not present

## 2021-02-16 DIAGNOSIS — D123 Benign neoplasm of transverse colon: Secondary | ICD-10-CM | POA: Diagnosis not present

## 2021-03-01 DIAGNOSIS — Z90722 Acquired absence of ovaries, bilateral: Secondary | ICD-10-CM | POA: Diagnosis not present

## 2021-03-01 DIAGNOSIS — Z124 Encounter for screening for malignant neoplasm of cervix: Secondary | ICD-10-CM | POA: Diagnosis not present

## 2021-03-01 DIAGNOSIS — Z9189 Other specified personal risk factors, not elsewhere classified: Secondary | ICD-10-CM | POA: Diagnosis not present

## 2021-03-01 DIAGNOSIS — M810 Age-related osteoporosis without current pathological fracture: Secondary | ICD-10-CM | POA: Diagnosis not present

## 2021-03-22 DIAGNOSIS — Z79811 Long term (current) use of aromatase inhibitors: Secondary | ICD-10-CM | POA: Diagnosis not present

## 2021-03-22 DIAGNOSIS — Z853 Personal history of malignant neoplasm of breast: Secondary | ICD-10-CM | POA: Diagnosis not present

## 2021-03-22 DIAGNOSIS — Z5181 Encounter for therapeutic drug level monitoring: Secondary | ICD-10-CM | POA: Diagnosis not present

## 2021-03-22 DIAGNOSIS — Z08 Encounter for follow-up examination after completed treatment for malignant neoplasm: Secondary | ICD-10-CM | POA: Diagnosis not present

## 2021-03-22 DIAGNOSIS — Z1379 Encounter for other screening for genetic and chromosomal anomalies: Secondary | ICD-10-CM | POA: Diagnosis not present

## 2021-03-22 DIAGNOSIS — Z923 Personal history of irradiation: Secondary | ICD-10-CM | POA: Diagnosis not present

## 2021-03-22 DIAGNOSIS — Z170 Estrogen receptor positive status [ER+]: Secondary | ICD-10-CM | POA: Diagnosis not present

## 2021-03-22 DIAGNOSIS — C50911 Malignant neoplasm of unspecified site of right female breast: Secondary | ICD-10-CM | POA: Diagnosis not present

## 2021-03-22 DIAGNOSIS — Z9011 Acquired absence of right breast and nipple: Secondary | ICD-10-CM | POA: Diagnosis not present

## 2021-03-22 DIAGNOSIS — Z1239 Encounter for other screening for malignant neoplasm of breast: Secondary | ICD-10-CM | POA: Diagnosis not present

## 2021-03-22 DIAGNOSIS — Z9189 Other specified personal risk factors, not elsewhere classified: Secondary | ICD-10-CM | POA: Diagnosis not present

## 2021-03-22 DIAGNOSIS — M810 Age-related osteoporosis without current pathological fracture: Secondary | ICD-10-CM | POA: Diagnosis not present

## 2021-04-16 DIAGNOSIS — M25561 Pain in right knee: Secondary | ICD-10-CM | POA: Diagnosis not present

## 2021-08-09 DIAGNOSIS — M79644 Pain in right finger(s): Secondary | ICD-10-CM | POA: Diagnosis not present

## 2021-09-04 DIAGNOSIS — H2513 Age-related nuclear cataract, bilateral: Secondary | ICD-10-CM | POA: Diagnosis not present

## 2021-09-04 DIAGNOSIS — H0102B Squamous blepharitis left eye, upper and lower eyelids: Secondary | ICD-10-CM | POA: Diagnosis not present

## 2021-09-04 DIAGNOSIS — H10413 Chronic giant papillary conjunctivitis, bilateral: Secondary | ICD-10-CM | POA: Diagnosis not present

## 2021-09-04 DIAGNOSIS — H179 Unspecified corneal scar and opacity: Secondary | ICD-10-CM | POA: Diagnosis not present

## 2021-09-04 DIAGNOSIS — H04123 Dry eye syndrome of bilateral lacrimal glands: Secondary | ICD-10-CM | POA: Diagnosis not present

## 2021-09-04 DIAGNOSIS — H0102A Squamous blepharitis right eye, upper and lower eyelids: Secondary | ICD-10-CM | POA: Diagnosis not present

## 2021-09-28 DIAGNOSIS — D0502 Lobular carcinoma in situ of left breast: Secondary | ICD-10-CM | POA: Diagnosis not present

## 2021-09-28 DIAGNOSIS — Z1239 Encounter for other screening for malignant neoplasm of breast: Secondary | ICD-10-CM | POA: Diagnosis not present

## 2021-09-28 DIAGNOSIS — Z853 Personal history of malignant neoplasm of breast: Secondary | ICD-10-CM | POA: Diagnosis not present

## 2021-09-28 DIAGNOSIS — Z23 Encounter for immunization: Secondary | ICD-10-CM | POA: Diagnosis not present

## 2021-09-28 DIAGNOSIS — Z803 Family history of malignant neoplasm of breast: Secondary | ICD-10-CM | POA: Diagnosis not present

## 2021-09-28 DIAGNOSIS — Z1379 Encounter for other screening for genetic and chromosomal anomalies: Secondary | ICD-10-CM | POA: Diagnosis not present

## 2021-09-28 DIAGNOSIS — Z5181 Encounter for therapeutic drug level monitoring: Secondary | ICD-10-CM | POA: Diagnosis not present

## 2021-09-28 DIAGNOSIS — Z79811 Long term (current) use of aromatase inhibitors: Secondary | ICD-10-CM | POA: Diagnosis not present

## 2021-09-28 DIAGNOSIS — B369 Superficial mycosis, unspecified: Secondary | ICD-10-CM | POA: Diagnosis not present

## 2021-09-28 DIAGNOSIS — Z9011 Acquired absence of right breast and nipple: Secondary | ICD-10-CM | POA: Diagnosis not present

## 2021-09-28 DIAGNOSIS — R928 Other abnormal and inconclusive findings on diagnostic imaging of breast: Secondary | ICD-10-CM | POA: Diagnosis not present

## 2021-09-28 DIAGNOSIS — Z79899 Other long term (current) drug therapy: Secondary | ICD-10-CM | POA: Diagnosis not present

## 2021-09-28 DIAGNOSIS — Z923 Personal history of irradiation: Secondary | ICD-10-CM | POA: Diagnosis not present

## 2021-09-28 DIAGNOSIS — Z170 Estrogen receptor positive status [ER+]: Secondary | ICD-10-CM | POA: Diagnosis not present

## 2021-09-28 DIAGNOSIS — C50911 Malignant neoplasm of unspecified site of right female breast: Secondary | ICD-10-CM | POA: Diagnosis not present

## 2021-09-28 DIAGNOSIS — M810 Age-related osteoporosis without current pathological fracture: Secondary | ICD-10-CM | POA: Diagnosis not present

## 2021-09-28 DIAGNOSIS — Z08 Encounter for follow-up examination after completed treatment for malignant neoplasm: Secondary | ICD-10-CM | POA: Diagnosis not present

## 2021-09-28 DIAGNOSIS — Z9189 Other specified personal risk factors, not elsewhere classified: Secondary | ICD-10-CM | POA: Diagnosis not present

## 2021-10-15 DIAGNOSIS — H0289 Other specified disorders of eyelid: Secondary | ICD-10-CM | POA: Diagnosis not present

## 2021-11-16 DIAGNOSIS — H029 Unspecified disorder of eyelid: Secondary | ICD-10-CM | POA: Diagnosis not present

## 2021-11-26 DIAGNOSIS — E039 Hypothyroidism, unspecified: Secondary | ICD-10-CM | POA: Diagnosis not present

## 2021-11-26 DIAGNOSIS — E559 Vitamin D deficiency, unspecified: Secondary | ICD-10-CM | POA: Diagnosis not present

## 2021-11-26 DIAGNOSIS — E782 Mixed hyperlipidemia: Secondary | ICD-10-CM | POA: Diagnosis not present

## 2021-11-26 DIAGNOSIS — Z79899 Other long term (current) drug therapy: Secondary | ICD-10-CM | POA: Diagnosis not present

## 2021-11-28 DIAGNOSIS — G809 Cerebral palsy, unspecified: Secondary | ICD-10-CM | POA: Diagnosis not present

## 2021-11-28 DIAGNOSIS — F419 Anxiety disorder, unspecified: Secondary | ICD-10-CM | POA: Diagnosis not present

## 2021-11-28 DIAGNOSIS — R011 Cardiac murmur, unspecified: Secondary | ICD-10-CM | POA: Diagnosis not present

## 2021-11-28 DIAGNOSIS — E782 Mixed hyperlipidemia: Secondary | ICD-10-CM | POA: Diagnosis not present

## 2021-11-28 DIAGNOSIS — M810 Age-related osteoporosis without current pathological fracture: Secondary | ICD-10-CM | POA: Diagnosis not present

## 2021-11-28 DIAGNOSIS — E039 Hypothyroidism, unspecified: Secondary | ICD-10-CM | POA: Diagnosis not present

## 2021-11-28 DIAGNOSIS — G4700 Insomnia, unspecified: Secondary | ICD-10-CM | POA: Diagnosis not present

## 2021-11-28 DIAGNOSIS — Z0000 Encounter for general adult medical examination without abnormal findings: Secondary | ICD-10-CM | POA: Diagnosis not present

## 2021-11-28 DIAGNOSIS — Z23 Encounter for immunization: Secondary | ICD-10-CM | POA: Diagnosis not present

## 2021-11-29 DIAGNOSIS — Z48810 Encounter for surgical aftercare following surgery on the sense organs: Secondary | ICD-10-CM | POA: Diagnosis not present

## 2021-12-04 DIAGNOSIS — Z6834 Body mass index (BMI) 34.0-34.9, adult: Secondary | ICD-10-CM | POA: Diagnosis not present

## 2021-12-04 DIAGNOSIS — R059 Cough, unspecified: Secondary | ICD-10-CM | POA: Diagnosis not present

## 2022-01-02 DIAGNOSIS — J0100 Acute maxillary sinusitis, unspecified: Secondary | ICD-10-CM | POA: Diagnosis not present

## 2022-01-02 DIAGNOSIS — Z03818 Encounter for observation for suspected exposure to other biological agents ruled out: Secondary | ICD-10-CM | POA: Diagnosis not present

## 2022-01-02 DIAGNOSIS — Z6833 Body mass index (BMI) 33.0-33.9, adult: Secondary | ICD-10-CM | POA: Diagnosis not present

## 2022-01-02 DIAGNOSIS — R051 Acute cough: Secondary | ICD-10-CM | POA: Diagnosis not present

## 2022-01-02 DIAGNOSIS — J069 Acute upper respiratory infection, unspecified: Secondary | ICD-10-CM | POA: Diagnosis not present

## 2022-01-04 DIAGNOSIS — Z853 Personal history of malignant neoplasm of breast: Secondary | ICD-10-CM | POA: Diagnosis not present

## 2022-01-04 DIAGNOSIS — C50911 Malignant neoplasm of unspecified site of right female breast: Secondary | ICD-10-CM | POA: Diagnosis not present

## 2022-01-04 DIAGNOSIS — Z170 Estrogen receptor positive status [ER+]: Secondary | ICD-10-CM | POA: Diagnosis not present

## 2022-01-04 DIAGNOSIS — R928 Other abnormal and inconclusive findings on diagnostic imaging of breast: Secondary | ICD-10-CM | POA: Diagnosis not present

## 2022-01-04 DIAGNOSIS — R21 Rash and other nonspecific skin eruption: Secondary | ICD-10-CM | POA: Diagnosis not present

## 2022-01-23 DIAGNOSIS — K644 Residual hemorrhoidal skin tags: Secondary | ICD-10-CM | POA: Diagnosis not present

## 2022-02-11 ENCOUNTER — Encounter: Payer: Self-pay | Admitting: Interventional Cardiology

## 2022-02-11 ENCOUNTER — Ambulatory Visit: Payer: Medicare PPO | Attending: Interventional Cardiology | Admitting: Interventional Cardiology

## 2022-02-11 VITALS — BP 122/82 | HR 83 | Ht 65.5 in | Wt 181.8 lb

## 2022-02-11 DIAGNOSIS — E663 Overweight: Secondary | ICD-10-CM

## 2022-02-11 DIAGNOSIS — E039 Hypothyroidism, unspecified: Secondary | ICD-10-CM | POA: Diagnosis not present

## 2022-02-11 DIAGNOSIS — R011 Cardiac murmur, unspecified: Secondary | ICD-10-CM

## 2022-02-11 DIAGNOSIS — E782 Mixed hyperlipidemia: Secondary | ICD-10-CM

## 2022-02-11 DIAGNOSIS — R0609 Other forms of dyspnea: Secondary | ICD-10-CM | POA: Diagnosis not present

## 2022-02-11 NOTE — Progress Notes (Signed)
Cardiology Office Note   Date:  02/11/2022   ID:  Jennifer Eaton 07/25/1950, MRN [id number]  PCP:  Lawerance Cruel, MD    No chief complaint on file.  murmur  Wt Readings from Last 3 Encounters:  02/11/22 181 lb 12.8 oz (82.5 kg)  11/25/16 182 lb (82.6 kg)  09/29/14 171 lb 6.4 oz (77.7 kg)       History of Present Illness: Jennifer Eaton is a 72 y.o. female who is being seen today for the evaluation of heart murmur at the request of Lawerance Cruel, MD.   Prior records show: "Who has a family h/o heart disease.  Her father had an MI in his 33s but he was a heavy smoker.  Mother died from Multiple myeloma.  Her brother has had a heart catheterization without any known revascularization.   She was found to have a heart murmur around 2015.  Echo in 2016 showed: "Left ventricle: The cavity size was normal. Wall thickness was    normal. Systolic function was normal. The estimated ejection    fraction was in the range of 55% to 60%. Wall motion was normal;    there were no regional wall motion abnormalities. Doppler    parameters are consistent with abnormal left ventricular    relaxation (grade 1 diastolic dysfunction).  - Aortic valve: There was no stenosis.  - Mitral valve: There was trivial regurgitation.  - Right ventricle: The cavity size was normal. Systolic function    was normal.  - Tricuspid valve: Peak RV-RA gradient (S): 24 mm Hg.  - Pulmonary arteries: PA peak pressure: 27 mm Hg (S).  - Inferior vena cava: The vessel was normal in size. The    respirophasic diameter changes were in the normal range (>= 50%),    consistent with normal central venous pressure. "  Had half of her thyroid removed in 2014.Denies : Chest pain. Dizziness. Leg edema. Nitroglycerin use. Orthopnea. Palpitations. Paroxysmal nocturnal dyspnea.  Syncope.     Past Medical History:  Diagnosis Date   ADD (attention deficit disorder) without hyperactivity    Amnestic MCI  (mild cognitive impairment with memory loss) 12/23/2012   Anxiety    Arthritis    Breast cancer of lower-outer quadrant of right female breast [facility])    born after mother was treated with DHES, shortened cervix, LCIS ,    Cancer ([place])    hx lt breast cancer   Hyperlipemia    Right-sided hemiplegic cerebral palsy ([place])    Seasonal allergies    Shortness of breath    Superficial basal cell carcinoma (BCC) 06/14/2003   Left Arm/Shoulder    Past Surgical History:  Procedure Laterality Date   ABDOMINAL HYSTERECTOMY  2000   hyst-BSO   BREAST SURGERY  10/11   lt lumpectomy-snbx   BREAST SURGERY  12/11   lt breast re-excision   COLONOSCOPY     GREAT TOE ARTHRODESIS, INTERPHALANGEAL JOINT     lt great toe-plate   MOUTH SURGERY  2013   thyroidglossal cyst Left 09-10-12    [facility] [place]  01/03/2012   Procedure: RELEASE TRIGGER FINGER/A-1 PULLEY;  Surgeon: Cammie Sickle., MD;  Location: [facility];  Service: Orthopedics;  Laterality: Left;     Current Outpatient Medications  Medication Sig Dispense Refill   Cholecalciferol 2000 UNITS CAPS Take 0.5 capsules by mouth daily.     co-enzyme Q-10 50 MG capsule  Take 100 mg by mouth daily.     diazepam (VALIUM) 5 MG tablet Take 5 mg by mouth every 8 (eight) hours as needed for anxiety. As needed for medical procedures.     fish oil-omega-3 fatty acids 1000 MG capsule Take 1 g by mouth daily.     fluticasone (FLONASE) 50 MCG/ACT nasal spray Place 2 sprays into the nose daily as needed (cold).      hydrocortisone-pramoxine (ANALPRAM HC) 2.5-1 % rectal cream 1 application Externally Three times a day for 30 days     ibuprofen (ADVIL,MOTRIN) 200 MG tablet Take 200 mg by mouth every 6 (six) hours as needed (pain).      Multiple Vitamin CAPS Take 1 capsule by mouth daily.      Probiotic Product (PROBIOTIC DAILY PO) Take 1 tablet by mouth daily. [place]     rosuvastatin (CRESTOR) 5 MG tablet Take  5 mg by mouth daily.     SYNTHROID 88 MCG tablet Take 88 mcg by mouth daily. 88 mcg Mon and Fri- 75 mcg every other day  2   tiZANidine (ZANAFLEX) 2 MG tablet TAKE 1 TO 2 TABLETS BY MOUTH 3 TIMES A DAY AS NEEDED FOR TIGHT MUSCLES  4   zolpidem (AMBIEN) 10 MG tablet Take 5 mg by mouth at bedtime as needed for sleep.   0   nystatin (MYCOSTATIN/NYSTOP) powder Apply topically 2 (two) times daily.     No current facility-administered medications for this visit.    Allergies:   Atorvastatin calcium, Cephalexin, Influenza virus vaccine, Lipitor [atorvastatin], Vancomycin, Amoxicillin, and Latex    Social History:  The patient  reports that she has never smoked. She has never used smokeless tobacco. She reports that she does not drink alcohol and does not use drugs.   Family History:  The patient's family history includes ADD / ADHD in her son; Coronary artery disease in her father; Multiple myeloma in her mother; Seizures in her brother; Stroke in her brother.    ROS:  Please see the history of present illness.   Otherwise, review of systems are positive for some DOE on occasion.   All other systems are reviewed and negative.    PHYSICAL EXAM: VS:  BP 122/82   Pulse 83   Ht 5' 5.5" (1.664 m)   Wt 181 lb 12.8 oz (82.5 kg)   SpO2 97%   BMI 29.79 kg/m  , BMI Body mass index is 29.79 kg/m. GEN: Well nourished, well developed, in no acute distress HEENT: normal Neck: no JVD, carotid bruits, or masses Cardiac: RRR; 2/6 systolic murmur, rubs, or gallops,no edema  Respiratory:  clear to auscultation bilaterally, normal work of breathing GI: soft, nontender, nondistended, + BS MS: no deformity or atrophy Skin: warm and dry, no rash Neuro:  Strength and sensation are intact Psych: euthymic mood, full affect   EKG:   The ekg ordered today demonstrates normal sinus rhythm   Recent Labs: No results found for requested labs within last 365 days.   Lipid Panel No results found for: "CHOL",  "TRIG", "HDL", "CHOLHDL", "VLDL", "LDLCALC", "LDLDIRECT"   Other studies Reviewed: Additional studies/ records that were reviewed today with results demonstrating: labs reviewed.   ASSESSMENT AND PLAN:  Murmur/DOE: plan for echo.  Only had trivial MR in the past.  No CHF symptoms on exam. Overweight: intentional weight loss. Portion control.  Hyperlipidemia: COntinue Crestor. LDL 82 in 2023. Hypothyroid: CONtinue synthroid.  Calcium scoring CT- discussed and she is willing to  take.  We will determine need for aspirin and statin dose with this test.    Current medicines are reviewed at length with the patient today.  The patient concerns regarding her medicines were addressed.  The following changes have been made:  No change  Labs/ tests ordered today include: as above No orders of the defined types were placed in this encounter.   Recommend 150 minutes/week of aerobic exercise Low fat, low carb, high fiber diet recommended  Disposition:   FU in based on test results   Signed, Larae Grooms, MD  02/11/2022 4:23 PM    [facility] [address], [place], [place]  [address] Phone: [phone]; Fax: [phone]

## 2022-02-11 NOTE — Patient Instructions (Signed)
Medication Instructions:  Your physician recommends that you continue on your current medications as directed. Please refer to the Current Medication list given to you today.  *If you need a refill on your cardiac medications before your next appointment, please call your pharmacy*   Lab Work: none If you have labs (blood work) drawn today and your tests are completely normal, you will receive your results only by: [place] (if you have MyChart) OR A paper copy in the mail If you have any lab test that is abnormal or we need to change your treatment, we will call you to review the results.   Testing/Procedures: Your physician has requested that you have an echocardiogram. Echocardiography is a painless test that uses sound waves to create images of your heart. It provides your doctor with information about the size and shape of your heart and how well your heart's chambers and valves are working. This procedure takes approximately one hour. There are no restrictions for this procedure. Please do NOT wear cologne, perfume, aftershave, or lotions (deodorant is allowed). Please arrive 15 minutes prior to your appointment time.  Dr Irish Lack recommends you have a Calcium Score CT Scan   Follow-Up: At [facility], you and your health needs are our priority.  As part of our continuing mission to provide you with exceptional heart care, we have created designated Provider Care Teams.  These Care Teams include your primary Cardiologist (physician) and Advanced Practice Providers (APPs -  Physician Assistants and Nurse Practitioners) who all work together to provide you with the care you need, when you need it.  We recommend signing up for the patient portal called "MyChart".  Sign up information is provided on this After Visit Summary.  MyChart is used to connect with patients for Virtual Visits (Telemedicine).  Patients are able to view lab/test results, encounter notes, upcoming  appointments, etc.  Non-urgent messages can be sent to your provider as well.   To learn more about what you can do with MyChart, go to [email or web address].    Your next appointment:   Based on results  Provider:   Larae Grooms, MD     Other Instructions

## 2022-02-25 ENCOUNTER — Ambulatory Visit (HOSPITAL_BASED_OUTPATIENT_CLINIC_OR_DEPARTMENT_OTHER)
Admission: RE | Admit: 2022-02-25 | Discharge: 2022-02-25 | Disposition: A | Discharge status: Home / Self Care | Payer: Medicare PPO | Source: Ambulatory Visit | Attending: Interventional Cardiology | Admitting: Interventional Cardiology

## 2022-02-25 DIAGNOSIS — R0609 Other forms of dyspnea: Secondary | ICD-10-CM | POA: Insufficient documentation

## 2022-02-25 DIAGNOSIS — E782 Mixed hyperlipidemia: Secondary | ICD-10-CM | POA: Insufficient documentation

## 2022-02-25 DIAGNOSIS — R011 Cardiac murmur, unspecified: Secondary | ICD-10-CM | POA: Insufficient documentation

## 2022-02-27 ENCOUNTER — Ambulatory Visit (HOSPITAL_BASED_OUTPATIENT_CLINIC_OR_DEPARTMENT_OTHER): Payer: Medicare PPO

## 2022-02-28 ENCOUNTER — Telehealth: Payer: Self-pay | Admitting: *Deleted

## 2022-02-28 DIAGNOSIS — E782 Mixed hyperlipidemia: Secondary | ICD-10-CM

## 2022-02-28 MED ORDER — ASPIRIN 81 MG PO TBEC: 81.0000 mg | DELAYED_RELEASE_TABLET | Freq: Every day | ORAL | 3 refills | Status: AC

## 2022-02-28 MED ORDER — ROSUVASTATIN CALCIUM 10 MG PO TABS: 10.0000 mg | ORAL_TABLET | Freq: Every day | ORAL | 3 refills | Status: AC

## 2022-02-28 NOTE — Telephone Encounter (Signed)
   Jettie Booze, MD 02/26/2022  7:49 AM EST     Mildly increased calcium score.  Increase rosuvastatin to 10 mg daily to get LDL to 70.  OK to start aspirin 81 mg daily if no bleeding issues.  Liver and lipid tests in 3 months.  Healthy diet.  Regular exercise.  F/u in 1 year

## 2022-02-28 NOTE — Telephone Encounter (Signed)
-----   Message from Jettie Booze, MD sent at 02/27/2022  1:36 PM EST ----- I think I already addressed this.  Start aspirin 81 mg daily and increase rosuvastatin to 10 mg daily. Lipids liver in 3 months.

## 2022-02-28 NOTE — Telephone Encounter (Signed)
I spoke with patient and reviewed results with her.  Prescription sent to [facility] in [place].  Patient will come in for fasting lab work on May 21, 2022.  She has no bleeding issues and will start aspirin 81 mg daily

## 2022-03-05 ENCOUNTER — Ambulatory Visit (HOSPITAL_COMMUNITY): Payer: Medicare PPO | Attending: Interventional Cardiology

## 2022-03-05 DIAGNOSIS — R0609 Other forms of dyspnea: Secondary | ICD-10-CM | POA: Diagnosis not present

## 2022-03-05 DIAGNOSIS — E782 Mixed hyperlipidemia: Secondary | ICD-10-CM | POA: Diagnosis not present

## 2022-03-05 DIAGNOSIS — R011 Cardiac murmur, unspecified: Secondary | ICD-10-CM | POA: Insufficient documentation

## 2022-03-05 LAB — ECHOCARDIOGRAM COMPLETE
Area-P 1/2: 4.45 cm2
S' Lateral: 3.5 cm

## 2022-03-07 DIAGNOSIS — K649 Unspecified hemorrhoids: Secondary | ICD-10-CM | POA: Diagnosis not present

## 2022-03-07 DIAGNOSIS — Z9189 Other specified personal risk factors, not elsewhere classified: Secondary | ICD-10-CM | POA: Diagnosis not present

## 2022-03-07 DIAGNOSIS — B372 Candidiasis of skin and nail: Secondary | ICD-10-CM | POA: Diagnosis not present

## 2022-03-29 DIAGNOSIS — Z170 Estrogen receptor positive status [ER+]: Secondary | ICD-10-CM | POA: Diagnosis not present

## 2022-03-29 DIAGNOSIS — C50911 Malignant neoplasm of unspecified site of right female breast: Secondary | ICD-10-CM | POA: Diagnosis not present

## 2022-03-29 DIAGNOSIS — Z1239 Encounter for other screening for malignant neoplasm of breast: Secondary | ICD-10-CM | POA: Diagnosis not present

## 2022-03-29 DIAGNOSIS — Z9189 Other specified personal risk factors, not elsewhere classified: Secondary | ICD-10-CM | POA: Diagnosis not present

## 2022-04-24 DIAGNOSIS — C50911 Malignant neoplasm of unspecified site of right female breast: Secondary | ICD-10-CM | POA: Diagnosis not present

## 2022-04-25 DIAGNOSIS — C50911 Malignant neoplasm of unspecified site of right female breast: Secondary | ICD-10-CM | POA: Diagnosis not present

## 2022-04-25 DIAGNOSIS — Z510 Encounter for antineoplastic radiation therapy: Secondary | ICD-10-CM | POA: Diagnosis not present

## 2022-04-25 DIAGNOSIS — Z9189 Other specified personal risk factors, not elsewhere classified: Secondary | ICD-10-CM | POA: Diagnosis not present

## 2022-04-25 DIAGNOSIS — Z170 Estrogen receptor positive status [ER+]: Secondary | ICD-10-CM | POA: Diagnosis not present

## 2022-04-25 DIAGNOSIS — Z79811 Long term (current) use of aromatase inhibitors: Secondary | ICD-10-CM | POA: Diagnosis not present

## 2022-04-25 DIAGNOSIS — Z1379 Encounter for other screening for genetic and chromosomal anomalies: Secondary | ICD-10-CM | POA: Diagnosis not present

## 2022-04-25 DIAGNOSIS — D0502 Lobular carcinoma in situ of left breast: Secondary | ICD-10-CM | POA: Diagnosis not present

## 2022-04-25 DIAGNOSIS — Z923 Personal history of irradiation: Secondary | ICD-10-CM | POA: Diagnosis not present

## 2022-04-25 DIAGNOSIS — Z5181 Encounter for therapeutic drug level monitoring: Secondary | ICD-10-CM | POA: Diagnosis not present

## 2022-05-21 ENCOUNTER — Ambulatory Visit: Payer: Medicare PPO | Attending: Interventional Cardiology

## 2022-05-21 DIAGNOSIS — E782 Mixed hyperlipidemia: Secondary | ICD-10-CM | POA: Diagnosis not present

## 2022-05-21 LAB — HEPATIC FUNCTION PANEL
ALT: 18 IU/L (ref 0–32)
AST: 18 IU/L (ref 0–40)
Albumin: 4.2 g/dL (ref 3.8–4.8)
Alkaline Phosphatase: 93 IU/L (ref 44–121)
Bilirubin Total: 0.3 mg/dL (ref 0.0–1.2)
Bilirubin, Direct: 0.11 mg/dL (ref 0.00–0.40)
Total Protein: 6.1 g/dL (ref 6.0–8.5)

## 2022-05-21 LAB — LIPID PANEL
Chol/HDL Ratio: 2.5 ratio (ref 0.0–4.4)
Cholesterol, Total: 129 mg/dL (ref 100–199)
HDL: 52 mg/dL (ref >39)
LDL Chol Calc (NIH): 62 mg/dL (ref 0–99)
Triglycerides: 75 mg/dL (ref 0–149)
VLDL Cholesterol Cal: 15 mg/dL (ref 5–40)

## 2022-07-19 DIAGNOSIS — C50911 Malignant neoplasm of unspecified site of right female breast: Secondary | ICD-10-CM | POA: Diagnosis not present

## 2022-09-08 DIAGNOSIS — S61212A Laceration without foreign body of right middle finger without damage to nail, initial encounter: Secondary | ICD-10-CM | POA: Diagnosis not present

## 2022-09-09 DIAGNOSIS — L905 Scar conditions and fibrosis of skin: Secondary | ICD-10-CM | POA: Diagnosis not present

## 2022-09-09 DIAGNOSIS — L821 Other seborrheic keratosis: Secondary | ICD-10-CM | POA: Diagnosis not present

## 2022-09-09 DIAGNOSIS — L578 Other skin changes due to chronic exposure to nonionizing radiation: Secondary | ICD-10-CM | POA: Diagnosis not present

## 2022-09-09 DIAGNOSIS — L814 Other melanin hyperpigmentation: Secondary | ICD-10-CM | POA: Diagnosis not present

## 2022-09-10 DIAGNOSIS — H0102A Squamous blepharitis right eye, upper and lower eyelids: Secondary | ICD-10-CM | POA: Diagnosis not present

## 2022-09-10 DIAGNOSIS — H04123 Dry eye syndrome of bilateral lacrimal glands: Secondary | ICD-10-CM | POA: Diagnosis not present

## 2022-09-10 DIAGNOSIS — H179 Unspecified corneal scar and opacity: Secondary | ICD-10-CM | POA: Diagnosis not present

## 2022-09-10 DIAGNOSIS — H0102B Squamous blepharitis left eye, upper and lower eyelids: Secondary | ICD-10-CM | POA: Diagnosis not present

## 2022-09-10 DIAGNOSIS — H10413 Chronic giant papillary conjunctivitis, bilateral: Secondary | ICD-10-CM | POA: Diagnosis not present

## 2022-09-10 DIAGNOSIS — H2513 Age-related nuclear cataract, bilateral: Secondary | ICD-10-CM | POA: Diagnosis not present

## 2023-04-21 ENCOUNTER — Ambulatory Visit (HOSPITAL_BASED_OUTPATIENT_CLINIC_OR_DEPARTMENT_OTHER): Payer: Medicare PPO | Admitting: Cardiology

## 2023-04-21 VITALS — BP 110/72 | HR 76 | Ht 65.5 in | Wt 140.5 lb

## 2023-04-21 DIAGNOSIS — R011 Cardiac murmur, unspecified: Secondary | ICD-10-CM | POA: Diagnosis not present

## 2023-04-21 DIAGNOSIS — E782 Mixed hyperlipidemia: Secondary | ICD-10-CM

## 2023-04-21 DIAGNOSIS — E039 Hypothyroidism, unspecified: Secondary | ICD-10-CM

## 2023-04-21 NOTE — Progress Notes (Signed)
 Cardiology Office Note:  .   Date:  04/21/2023  ID:  Alphonzo Grieve, DOB [birth year], MRN [id number] PCP: Daisy Floro, MD  [facility] Providers Cardiologist:  Lance Muss, MD     History of Present Illness: .   Jennifer Eaton is a 73 y.o. female Discussed the use of AI scribe software for clinical note transcription with the patient, who gave verbal consent to proceed.  History of Present Illness Jennifer Eaton "Jennifer Eaton" is a 73 year old female with coronary artery disease who presents for follow-up on coronary artery calcification.  She has a history of coronary artery disease with a coronary calcium score of 178 recorded on February 25, 2022, placing her in the 78th percentile. An echocardiogram on March 05, 2022, showed normal left ventricular, right ventricular, and valvular function with an ejection fraction of 50-55%. She is asymptomatic, maintaining an active lifestyle with no shortness of breath and the ability to go up and down stairs without difficulty.  She has hyperlipidemia and is currently taking rosuvastatin 10 mg once daily. Her LDL cholesterol was 76 in November 2024. She also takes aspirin 81 mg daily for coronary calcium. She inquires about the use of CoQ10 and mentions stopping fish oil after resuming aspirin. She takes an adult multivitamin without iron.  She has a history of a heart murmur, likely a flow murmur, with no significant valvular abnormalities. Trivial mitral regurgitation was noted previously.  She has hypothyroidism, for which she takes Synthroid 88 mcg on Monday and Friday, and 75 mcg every other day. She had half of her thyroid removed in 2014. Current TSH is 1.8, indicating stable thyroid function.  She uses an Apple watch to track her steps and maintains a good weight after losing weight over the past year. She is proactive about her health, maintaining an active lifestyle and monitoring her health status.      ROS: No  CP  Studies Reviewed: Marland Kitchen   EKG Interpretation Date/Time:  Monday April 21 2023 09:19:35 EDT Ventricular Rate:  65 PR Interval:  166 QRS Duration:  74 QT Interval:  384 QTC Calculation: 399 R Axis:   77  Text Interpretation: Normal sinus rhythm Normal ECG When compared with ECG of 02-Nov-2009 14:55, No significant change was found Confirmed by Donato Schultz ([id number]) on 04/21/2023 9:42:47 AM    Results LABS LDL: 76 (11/2022) Hb: 14.3 K: 4.6 TSH: 1.8  RADIOLOGY Coronary calcium score: 178, 78th percentile (02/25/2022)  DIAGNOSTIC Echocardiogram: Normal LV, RV, and valvular function, EF 50-55%, trivial mitral regurgitation, mild tricuspid valve regurgitation, mild calcification of the aortic valve (03/05/2022) Risk Assessment/Calculations:            Physical Exam:   VS:  BP 110/72   Pulse 76   Ht 5' 5.5" (1.664 m)   Wt 140 lb 8 oz (63.7 kg)   SpO2 97%   BMI 23.02 kg/m    Wt Readings from Last 3 Encounters:  04/21/23 140 lb 8 oz (63.7 kg)  02/11/22 181 lb 12.8 oz (82.5 kg)  11/25/16 182 lb (82.6 kg)    GEN: Well nourished, well developed in no acute distress NECK: No JVD; No carotid bruits CARDIAC: RRR, no murmurs, no rubs, no gallops RESPIRATORY:  Clear to auscultation without rales, wheezing or rhonchi  ABDOMEN: Soft, non-tender, non-distended EXTREMITIES:  No edema; No deformity   ASSESSMENT AND PLAN: .    Assessment and Plan Assessment & Plan Coronary Artery Disease with Coronary Artery Calcification  She has coronary artery disease with a coronary calcium score of 178, placing her in the 78th percentile. An echocardiogram from February 2024 showed normal left ventricular, right ventricular, and valvular function with an ejection fraction of 50-55%. She is asymptomatic and able to perform daily activities without dyspnea. Aspirin therapy is continued to prevent further plaque formation. She was educated on signs of bleeding, such as gastrointestinal bleeding or  significant drops in hemoglobin. Her LDL cholesterol is slightly above the target of less than 70, prompting an increase in rosuvastatin to 10 mg daily. The goal is to reduce LDL to less than 70 to decrease cardiovascular event risk. - Continue aspirin 81 mg once daily. - Continue rosuvastatin 10 mg once daily. - Monitor for signs of bleeding, such as gastrointestinal bleeding or low hemoglobin. - Reassess LDL cholesterol levels at the next annual physical.  Hyperlipidemia She has hyperlipidemia and is on rosuvastatin 10 mg daily. Her LDL cholesterol was 76 in November 2024, slightly above the target of less than 70. The increased rosuvastatin dosage aims to further lower LDL levels to reduce cardiovascular event risk. She is informed that the current dose is safe and effective. The goal is to achieve an LDL level of less than 70, which reduces the risk of myocardial infarction. - Continue rosuvastatin 10 mg once daily. - Reassess LDL cholesterol levels at the next annual physical.  Heart Murmur She has a heart murmur, likely a flow murmur, with no significant valvular abnormalities. The echocardiogram showed trivial mitral regurgitation and mild tricuspid valve regurgitation, which are benign and do not require intervention. Mild calcification of the aortic valve is present but not stenotic and not the cause of the murmur. The murmur is not associated with symptoms or functional impairment.  Hypothyroidism She has hypothyroidism managed with Synthroid 88 mcg on Mondays and Fridays, and 75 mcg on other days, following partial thyroidectomy in 2014. Current TSH levels are within normal range at 1.8. - Continue current Synthroid regimen.  Follow-up She is well-managed and we are comfortable with her being managed by her primary care physician, with the option to return to cardiology if needed. She is advised to continue her current medications and lifestyle, and to follow up with her primary care  physician for routine monitoring. - Graduate from cardiology clinic and follow up with primary care physician. - Return to cardiology if any new symptoms or concerns arise.          Signed, Donato Schultz, MD

## 2023-04-21 NOTE — Patient Instructions (Signed)
 Medication Instructions:  The current medical regimen is effective;  continue present plan and medications.  *If you need a refill on your cardiac medications before your next appointment, please call your pharmacy*  Follow-Up: At [facility], you and your health needs are our priority.  As part of our continuing mission to provide you with exceptional heart care, our providers are all part of one team.  This team includes your primary Cardiologist (physician) and Advanced Practice Providers or APPs (Physician Assistants and Nurse Practitioners) who all work together to provide you with the care you need, when you need it.  Your next appointment:   Follow up as needed with Dr Anne Fu  We recommend signing up for the patient portal called "MyChart".  Sign up information is provided on this After Visit Summary.  MyChart is used to connect with patients for Virtual Visits (Telemedicine).  Patients are able to view lab/test results, encounter notes, upcoming appointments, etc.  Non-urgent messages can be sent to your provider as well.   To learn more about what you can do with MyChart, go to [email or web address].

## 2023-06-17 DIAGNOSIS — H10413 Chronic giant papillary conjunctivitis, bilateral: Secondary | ICD-10-CM | POA: Diagnosis not present

## 2023-06-17 DIAGNOSIS — H0102A Squamous blepharitis right eye, upper and lower eyelids: Secondary | ICD-10-CM | POA: Diagnosis not present

## 2023-06-17 DIAGNOSIS — D492 Neoplasm of unspecified behavior of bone, soft tissue, and skin: Secondary | ICD-10-CM | POA: Diagnosis not present

## 2023-06-17 DIAGNOSIS — H0102B Squamous blepharitis left eye, upper and lower eyelids: Secondary | ICD-10-CM | POA: Diagnosis not present

## 2023-06-17 DIAGNOSIS — Z961 Presence of intraocular lens: Secondary | ICD-10-CM | POA: Diagnosis not present

## 2023-06-23 DIAGNOSIS — H029 Unspecified disorder of eyelid: Secondary | ICD-10-CM | POA: Diagnosis not present

## 2023-06-23 DIAGNOSIS — H02829 Cysts of unspecified eye, unspecified eyelid: Secondary | ICD-10-CM | POA: Diagnosis not present

## 2023-08-15 DIAGNOSIS — H029 Unspecified disorder of eyelid: Secondary | ICD-10-CM | POA: Diagnosis not present

## 2023-09-04 DIAGNOSIS — H02829 Cysts of unspecified eye, unspecified eyelid: Secondary | ICD-10-CM | POA: Diagnosis not present

## 2023-09-04 DIAGNOSIS — H029 Unspecified disorder of eyelid: Secondary | ICD-10-CM | POA: Diagnosis not present

## 2023-10-06 DIAGNOSIS — R92323 Mammographic fibroglandular density, bilateral breasts: Secondary | ICD-10-CM | POA: Diagnosis not present

## 2023-10-06 DIAGNOSIS — Z9189 Other specified personal risk factors, not elsewhere classified: Secondary | ICD-10-CM | POA: Diagnosis not present

## 2023-10-06 DIAGNOSIS — C50911 Malignant neoplasm of unspecified site of right female breast: Secondary | ICD-10-CM | POA: Diagnosis not present

## 2023-10-06 DIAGNOSIS — Z1231 Encounter for screening mammogram for malignant neoplasm of breast: Secondary | ICD-10-CM | POA: Diagnosis not present

## 2023-10-06 DIAGNOSIS — Z853 Personal history of malignant neoplasm of breast: Secondary | ICD-10-CM | POA: Diagnosis not present

## 2023-10-06 DIAGNOSIS — R928 Other abnormal and inconclusive findings on diagnostic imaging of breast: Secondary | ICD-10-CM | POA: Diagnosis not present

## 2023-10-06 DIAGNOSIS — Z170 Estrogen receptor positive status [ER+]: Secondary | ICD-10-CM | POA: Diagnosis not present

## 2023-10-27 DIAGNOSIS — L814 Other melanin hyperpigmentation: Secondary | ICD-10-CM | POA: Diagnosis not present

## 2023-10-27 DIAGNOSIS — L905 Scar conditions and fibrosis of skin: Secondary | ICD-10-CM | POA: Diagnosis not present

## 2023-10-27 DIAGNOSIS — L578 Other skin changes due to chronic exposure to nonionizing radiation: Secondary | ICD-10-CM | POA: Diagnosis not present

## 2023-10-27 DIAGNOSIS — L820 Inflamed seborrheic keratosis: Secondary | ICD-10-CM | POA: Diagnosis not present

## 2023-10-27 DIAGNOSIS — S90221A Contusion of right lesser toe(s) with damage to nail, initial encounter: Secondary | ICD-10-CM | POA: Diagnosis not present

## 2023-10-27 DIAGNOSIS — L821 Other seborrheic keratosis: Secondary | ICD-10-CM | POA: Diagnosis not present

## 2023-11-28 DIAGNOSIS — Z961 Presence of intraocular lens: Secondary | ICD-10-CM | POA: Diagnosis not present

## 2023-11-28 DIAGNOSIS — S0502XA Injury of conjunctiva and corneal abrasion without foreign body, left eye, initial encounter: Secondary | ICD-10-CM | POA: Diagnosis not present

## 2023-11-28 DIAGNOSIS — H0102A Squamous blepharitis right eye, upper and lower eyelids: Secondary | ICD-10-CM | POA: Diagnosis not present

## 2023-11-28 DIAGNOSIS — D492 Neoplasm of unspecified behavior of bone, soft tissue, and skin: Secondary | ICD-10-CM | POA: Diagnosis not present

## 2023-11-28 DIAGNOSIS — H0102B Squamous blepharitis left eye, upper and lower eyelids: Secondary | ICD-10-CM | POA: Diagnosis not present

## 2023-11-28 DIAGNOSIS — H10413 Chronic giant papillary conjunctivitis, bilateral: Secondary | ICD-10-CM | POA: Diagnosis not present

## 2023-12-03 DIAGNOSIS — E782 Mixed hyperlipidemia: Secondary | ICD-10-CM | POA: Diagnosis not present

## 2023-12-03 DIAGNOSIS — E559 Vitamin D deficiency, unspecified: Secondary | ICD-10-CM | POA: Diagnosis not present

## 2023-12-03 DIAGNOSIS — Z79899 Other long term (current) drug therapy: Secondary | ICD-10-CM | POA: Diagnosis not present

## 2023-12-03 DIAGNOSIS — E039 Hypothyroidism, unspecified: Secondary | ICD-10-CM | POA: Diagnosis not present

## 2023-12-11 DIAGNOSIS — F419 Anxiety disorder, unspecified: Secondary | ICD-10-CM | POA: Diagnosis not present

## 2023-12-11 DIAGNOSIS — E782 Mixed hyperlipidemia: Secondary | ICD-10-CM | POA: Diagnosis not present

## 2023-12-11 DIAGNOSIS — Z9189 Other specified personal risk factors, not elsewhere classified: Secondary | ICD-10-CM | POA: Diagnosis not present

## 2023-12-11 DIAGNOSIS — G809 Cerebral palsy, unspecified: Secondary | ICD-10-CM | POA: Diagnosis not present

## 2023-12-11 DIAGNOSIS — Z79899 Other long term (current) drug therapy: Secondary | ICD-10-CM | POA: Diagnosis not present

## 2023-12-11 DIAGNOSIS — Z0000 Encounter for general adult medical examination without abnormal findings: Secondary | ICD-10-CM | POA: Diagnosis not present

## 2023-12-11 DIAGNOSIS — Z6826 Body mass index (BMI) 26.0-26.9, adult: Secondary | ICD-10-CM | POA: Diagnosis not present

## 2023-12-11 DIAGNOSIS — E559 Vitamin D deficiency, unspecified: Secondary | ICD-10-CM | POA: Diagnosis not present

## 2023-12-11 DIAGNOSIS — E039 Hypothyroidism, unspecified: Secondary | ICD-10-CM | POA: Diagnosis not present
